# Patient Record
Sex: Female | Born: 2000 | Race: White | Hispanic: No | Marital: Single | Smoking: Never smoker
Health system: Southern US, Community
[De-identification: ages and names within clinical notes are randomized; demographics above are authoritative.]

## PROBLEM LIST (undated history)

## (undated) ENCOUNTER — Inpatient Hospital Stay (HOSPITAL_COMMUNITY): Payer: Self-pay

## (undated) DIAGNOSIS — R748 Abnormal levels of other serum enzymes: Secondary | ICD-10-CM

## (undated) DIAGNOSIS — O2686 Pruritic urticarial papules and plaques of pregnancy (PUPPP): Secondary | ICD-10-CM

## (undated) DIAGNOSIS — F909 Attention-deficit hyperactivity disorder, unspecified type: Secondary | ICD-10-CM

## (undated) HISTORY — DX: Abnormal levels of other serum enzymes: R74.8

## (undated) HISTORY — DX: Pruritic urticarial papules and plaques of pregnancy (puppp): O26.86

## (undated) HISTORY — DX: Attention-deficit hyperactivity disorder, unspecified type: F90.9

---
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
## 2022-02-15 ENCOUNTER — Ambulatory Visit: Payer: Medicaid Other

## 2022-03-16 ENCOUNTER — Ambulatory Visit (INDEPENDENT_AMBULATORY_CARE_PROVIDER_SITE_OTHER): Payer: Medicaid Other | Admitting: *Deleted

## 2022-03-16 DIAGNOSIS — Z3042 Encounter for surveillance of injectable contraceptive: Secondary | ICD-10-CM

## 2022-03-16 MED ORDER — MEDROXYPROGESTERONE ACETATE 150 MG/ML IM SUSP
150.0000 mg | Freq: Once | INTRAMUSCULAR | Status: AC
  Administered 2022-03-16: 150 mg via INTRAMUSCULAR

## 2022-03-16 NOTE — Progress Notes (Signed)
   NURSE VISIT- INJECTION  SUBJECTIVE:  Chelsea Armstrong is a 21 y.o. G48P1001 female here for a Depo Provera for contraception/period management. She is a GYN patient.   OBJECTIVE:  LMP 03/16/2022   Breastfeeding No   Appears well, in no apparent distress  Injection administered in: Right deltoid  Meds ordered this encounter  Medications   medroxyPROGESTERone (DEPO-PROVERA) injection 150 mg    ASSESSMENT: GYN patient Depo Provera for contraception/period management PLAN: Follow-up: in 11-13 weeks for next Depo   Levy Pupa  03/16/2022 4:02 PM

## 2022-03-29 ENCOUNTER — Ambulatory Visit: Payer: Medicaid Other | Admitting: Adult Health

## 2022-04-10 ENCOUNTER — Emergency Department (HOSPITAL_COMMUNITY)
Admission: EM | Admit: 2022-04-10 | Discharge: 2022-04-11 | Disposition: A | Discharge status: Home / Self Care | Payer: Medicaid Other | Attending: Emergency Medicine | Admitting: Emergency Medicine

## 2022-04-10 ENCOUNTER — Encounter (HOSPITAL_COMMUNITY): Payer: Self-pay | Admitting: Emergency Medicine

## 2022-04-10 ENCOUNTER — Other Ambulatory Visit: Payer: Self-pay

## 2022-04-10 DIAGNOSIS — M25531 Pain in right wrist: Secondary | ICD-10-CM | POA: Diagnosis not present

## 2022-04-10 DIAGNOSIS — M25552 Pain in left hip: Secondary | ICD-10-CM | POA: Insufficient documentation

## 2022-04-10 DIAGNOSIS — Z0471 Encounter for examination and observation following alleged adult physical abuse: Secondary | ICD-10-CM | POA: Diagnosis not present
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
## 2022-04-11 ENCOUNTER — Emergency Department (HOSPITAL_COMMUNITY): Payer: Medicaid Other

## 2022-04-11 DIAGNOSIS — Z0471 Encounter for examination and observation following alleged adult physical abuse: Secondary | ICD-10-CM | POA: Diagnosis not present

## 2022-04-11 DIAGNOSIS — M25552 Pain in left hip: Secondary | ICD-10-CM | POA: Diagnosis not present

## 2022-04-11 LAB — POC URINE PREG, ED: Preg Test, Ur: NEGATIVE

## 2022-04-11 MED ORDER — ACETAMINOPHEN 500 MG PO TABS: 1000.0000 mg | ORAL_TABLET | Freq: Once | ORAL | Status: DC

## 2022-04-11 NOTE — ED Notes (Signed)
Pt left AMA then came back then ran out.
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
## 2022-05-06 DIAGNOSIS — F4320 Adjustment disorder, unspecified: Secondary | ICD-10-CM | POA: Diagnosis not present

## 2022-06-08 ENCOUNTER — Ambulatory Visit: Payer: Medicaid Other

## 2022-06-26 DIAGNOSIS — Z20822 Contact with and (suspected) exposure to covid-19: Secondary | ICD-10-CM | POA: Diagnosis not present

## 2022-06-26 DIAGNOSIS — Z5941 Food insecurity: Secondary | ICD-10-CM | POA: Diagnosis not present

## 2022-06-26 DIAGNOSIS — Z1152 Encounter for screening for COVID-19: Secondary | ICD-10-CM | POA: Diagnosis not present

## 2022-06-26 DIAGNOSIS — K529 Noninfective gastroenteritis and colitis, unspecified: Secondary | ICD-10-CM | POA: Diagnosis not present

## 2022-06-26 DIAGNOSIS — E876 Hypokalemia: Secondary | ICD-10-CM | POA: Diagnosis not present

## 2022-09-02 ENCOUNTER — Ambulatory Visit (INDEPENDENT_AMBULATORY_CARE_PROVIDER_SITE_OTHER): Payer: Medicaid Other | Admitting: Adult Health

## 2022-09-02 ENCOUNTER — Encounter: Payer: Self-pay | Admitting: Adult Health

## 2022-09-02 VITALS — BP 117/74 | HR 85 | Ht 60.0 in | Wt 179.0 lb

## 2022-09-02 DIAGNOSIS — Z3202 Encounter for pregnancy test, result negative: Secondary | ICD-10-CM

## 2022-09-02 DIAGNOSIS — Z113 Encounter for screening for infections with a predominantly sexual mode of transmission: Secondary | ICD-10-CM

## 2022-09-02 DIAGNOSIS — Z30013 Encounter for initial prescription of injectable contraceptive: Secondary | ICD-10-CM | POA: Diagnosis not present

## 2022-09-02 LAB — POCT URINE PREGNANCY: Preg Test, Ur: NEGATIVE

## 2022-09-02 MED ORDER — MEDROXYPROGESTERONE ACETATE 150 MG/ML IM SUSY
150.0000 mg | PREFILLED_SYRINGE | Freq: Once | INTRAMUSCULAR | Status: AC
  Administered 2022-09-02: 150 mg via INTRAMUSCULAR

## 2022-09-02 MED ORDER — MEDROXYPROGESTERONE ACETATE 150 MG/ML IM SUSP: 150.0000 mg | INTRAMUSCULAR | 4 refills | Status: DC

## 2022-09-02 NOTE — Progress Notes (Signed)
  Subjective:     Patient ID: Chelsea Armstrong, female   DOB: [birth year], 22 y.o.   MRN: [id number]  HPI Chelsea Armstrong is a 22 year old white female,single,G1P1001, in to discuss getting on depo, has had in the past. Last sex over 2 weeks ago.  PCP is A Boles PA  Review of Systems Patient denies any headaches, hearing loss, fatigue, blurred vision, shortness of breath, chest pain, abdominal pain, problems with bowel movements, urination, or intercourse. No joint pain or mood swings. Reviewed past medical,surgical, social and family history. Reviewed medications and allergies.     Objective:   Physical Exam BP 117/74 (BP Location: Left Arm, Patient Position: Sitting, Cuff Size: Normal)   Pulse 85   Ht 5' (1.524 m)   Wt 179 lb (81.2 kg)   LMP 08/18/2022 (Approximate)   Breastfeeding No   BMI 34.96 kg/m  UPT is negative    Skin warm and dry.  Lungs: clear to ausculation bilaterally. Cardiovascular: regular rate and rhythm.  Fall risk is low  Upstream - 09/02/22 1126       Pregnancy Intention Screening   Does the patient want to become pregnant in the next year? No    Does the patient's partner want to become pregnant in the next year? No    Would the patient like to discuss contraceptive options today? No      Contraception Wrap Up   Current Method Abstinence    End Method Hormonal Injection    Contraception Counseling Provided Yes    How was the end contraceptive method provided? Provided on site   rx sent            Assessment:     1. Screening examination for STD (sexually transmitted disease) Urine sent for GC/CHL - GC/Chlamydia Probe Amp  2. Pregnancy examination or test, negative result - POCT urine pregnancy  3. Encounter for initial prescription of injectable contraceptive Pt wants depo, first injection given in office today Rx sent for depo provera to [facility] Meds ordered this encounter  Medications   medroxyPROGESTERone (DEPO-PROVERA) 150 MG/ML injection    Sig:  Inject 1 mL (150 mg total) into the muscle every 3 (three) months.    Dispense:  1 mL    Refill:  4    Order Specific Question:   Supervising Provider    Answer:   Duane Lope H [[id number]]   Use condoms for 2 weeks      Plan:     Return in 12 weeks for depo

## 2022-09-02 NOTE — Addendum Note (Signed)
Addended by: Colen Darling on: 09/02/2022 12:27 PM   Modules accepted: Orders

## 2022-09-04 LAB — GC/CHLAMYDIA PROBE AMP
Chlamydia trachomatis, NAA: NEGATIVE
Neisseria Gonorrhoeae by PCR: NEGATIVE

## 2022-10-02 DIAGNOSIS — L239 Allergic contact dermatitis, unspecified cause: Secondary | ICD-10-CM | POA: Diagnosis not present

## 2022-10-20 ENCOUNTER — Ambulatory Visit (INDEPENDENT_AMBULATORY_CARE_PROVIDER_SITE_OTHER): Payer: Medicaid Other | Admitting: *Deleted

## 2022-10-20 VITALS — BP 129/85 | HR 69 | Ht 60.0 in | Wt 174.2 lb

## 2022-10-20 DIAGNOSIS — Z3201 Encounter for pregnancy test, result positive: Secondary | ICD-10-CM

## 2022-10-20 LAB — POCT URINE PREGNANCY: Preg Test, Ur: POSITIVE — AB
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
## 2022-10-22 LAB — BETA HCG QUANT (REF LAB): hCG Quant: 83012 m[IU]/mL

## 2022-11-17 DIAGNOSIS — N3001 Acute cystitis with hematuria: Secondary | ICD-10-CM | POA: Diagnosis not present

## 2022-11-17 DIAGNOSIS — O2311 Infections of bladder in pregnancy, first trimester: Secondary | ICD-10-CM | POA: Diagnosis not present

## 2022-11-17 DIAGNOSIS — O219 Vomiting of pregnancy, unspecified: Secondary | ICD-10-CM | POA: Diagnosis not present

## 2022-11-17 DIAGNOSIS — Z3A13 13 weeks gestation of pregnancy: Secondary | ICD-10-CM | POA: Diagnosis not present

## 2022-11-17 DIAGNOSIS — R1030 Lower abdominal pain, unspecified: Secondary | ICD-10-CM | POA: Diagnosis not present

## 2022-11-19 ENCOUNTER — Other Ambulatory Visit: Payer: Self-pay | Admitting: Obstetrics & Gynecology

## 2022-11-19 DIAGNOSIS — O3680X0 Pregnancy with inconclusive fetal viability, not applicable or unspecified: Secondary | ICD-10-CM

## 2022-11-22 ENCOUNTER — Ambulatory Visit: Payer: Medicaid Other

## 2022-11-22 DIAGNOSIS — Z3481 Encounter for supervision of other normal pregnancy, first trimester: Secondary | ICD-10-CM

## 2022-11-22 DIAGNOSIS — Z3A13 13 weeks gestation of pregnancy: Secondary | ICD-10-CM | POA: Diagnosis not present

## 2022-11-22 DIAGNOSIS — O3680X0 Pregnancy with inconclusive fetal viability, not applicable or unspecified: Secondary | ICD-10-CM

## 2022-11-22 NOTE — Progress Notes (Signed)
[place] 13+5 wks,CRL 85.38 mm,fundal placenta,normal ovaries,FHR 153 bpm

## 2022-11-25 ENCOUNTER — Ambulatory Visit: Payer: Medicaid Other

## 2022-12-02 DIAGNOSIS — Z3490 Encounter for supervision of normal pregnancy, unspecified, unspecified trimester: Secondary | ICD-10-CM | POA: Insufficient documentation

## 2022-12-02 DIAGNOSIS — O0932 Supervision of pregnancy with insufficient antenatal care, second trimester: Secondary | ICD-10-CM | POA: Insufficient documentation

## 2022-12-09 ENCOUNTER — Ambulatory Visit: Payer: Medicaid Other | Admitting: *Deleted

## 2022-12-09 ENCOUNTER — Other Ambulatory Visit (HOSPITAL_COMMUNITY)
Admission: RE | Admit: 2022-12-09 | Discharge: 2022-12-09 | Disposition: A | Discharge status: Home / Self Care | Payer: Medicaid Other | Source: Ambulatory Visit | Attending: Obstetrics and Gynecology | Admitting: Obstetrics and Gynecology

## 2022-12-09 ENCOUNTER — Ambulatory Visit (INDEPENDENT_AMBULATORY_CARE_PROVIDER_SITE_OTHER): Payer: Medicaid Other | Admitting: Obstetrics and Gynecology

## 2022-12-09 VITALS — BP 105/72 | HR 107 | Wt 168.8 lb

## 2022-12-09 DIAGNOSIS — Z8759 Personal history of other complications of pregnancy, childbirth and the puerperium: Secondary | ICD-10-CM

## 2022-12-09 DIAGNOSIS — R1031 Right lower quadrant pain: Secondary | ICD-10-CM | POA: Diagnosis not present

## 2022-12-09 DIAGNOSIS — Z3480 Encounter for supervision of other normal pregnancy, unspecified trimester: Secondary | ICD-10-CM | POA: Insufficient documentation

## 2022-12-09 DIAGNOSIS — A749 Chlamydial infection, unspecified: Secondary | ICD-10-CM

## 2022-12-09 DIAGNOSIS — Z3A16 16 weeks gestation of pregnancy: Secondary | ICD-10-CM | POA: Diagnosis not present

## 2022-12-09 DIAGNOSIS — O98812 Other maternal infectious and parasitic diseases complicating pregnancy, second trimester: Secondary | ICD-10-CM | POA: Diagnosis not present

## 2022-12-09 DIAGNOSIS — O0932 Supervision of pregnancy with insufficient antenatal care, second trimester: Secondary | ICD-10-CM

## 2022-12-09 NOTE — Progress Notes (Signed)
Pt c/o cramping on right side.
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
## 2022-12-10 DIAGNOSIS — A749 Chlamydial infection, unspecified: Secondary | ICD-10-CM | POA: Insufficient documentation

## 2022-12-10 DIAGNOSIS — Z8619 Personal history of other infectious and parasitic diseases: Secondary | ICD-10-CM | POA: Insufficient documentation

## 2022-12-10 MED ORDER — AZITHROMYCIN 500 MG PO TABS
1000.0000 mg | ORAL_TABLET | Freq: Once | ORAL | 0 refills | Status: DC
Start: 2022-12-10 — End: 2022-12-13

## 2022-12-10 NOTE — Addendum Note (Signed)
Addended by: Sue Lush on: 12/10/2022 01:15 PM   Modules accepted: Orders

## 2022-12-13 MED ORDER — AZITHROMYCIN 500 MG PO TABS
1000.0000 mg | ORAL_TABLET | Freq: Once | ORAL | 0 refills | Status: AC
Start: 2022-12-13 — End: 2022-12-13

## 2022-12-13 NOTE — Addendum Note (Signed)
Addended by: Sue Lush on: 12/13/2022 09:50 AM   Modules accepted: Orders

## 2022-12-27 DIAGNOSIS — O219 Vomiting of pregnancy, unspecified: Secondary | ICD-10-CM | POA: Diagnosis not present

## 2022-12-27 DIAGNOSIS — Z6833 Body mass index (BMI) 33.0-33.9, adult: Secondary | ICD-10-CM | POA: Diagnosis not present

## 2022-12-27 DIAGNOSIS — E669 Obesity, unspecified: Secondary | ICD-10-CM | POA: Diagnosis not present

## 2022-12-27 DIAGNOSIS — R030 Elevated blood-pressure reading, without diagnosis of hypertension: Secondary | ICD-10-CM | POA: Diagnosis not present

## 2023-01-04 ENCOUNTER — Other Ambulatory Visit: Payer: Self-pay | Admitting: Obstetrics & Gynecology

## 2023-01-04 DIAGNOSIS — Z363 Encounter for antenatal screening for malformations: Secondary | ICD-10-CM

## 2023-01-05 ENCOUNTER — Ambulatory Visit (INDEPENDENT_AMBULATORY_CARE_PROVIDER_SITE_OTHER): Payer: Medicaid Other | Admitting: Obstetrics & Gynecology

## 2023-01-05 ENCOUNTER — Ambulatory Visit (INDEPENDENT_AMBULATORY_CARE_PROVIDER_SITE_OTHER): Payer: Medicaid Other

## 2023-01-05 ENCOUNTER — Encounter: Payer: Self-pay | Admitting: Obstetrics & Gynecology

## 2023-01-05 VITALS — BP 125/76 | HR 100 | Wt 174.2 lb

## 2023-01-05 DIAGNOSIS — Z363 Encounter for antenatal screening for malformations: Secondary | ICD-10-CM | POA: Diagnosis not present

## 2023-01-05 DIAGNOSIS — O35EXX0 Maternal care for other (suspected) fetal abnormality and damage, fetal genitourinary anomalies, not applicable or unspecified: Secondary | ICD-10-CM | POA: Insufficient documentation

## 2023-01-05 DIAGNOSIS — Z8619 Personal history of other infectious and parasitic diseases: Secondary | ICD-10-CM | POA: Diagnosis not present

## 2023-01-05 DIAGNOSIS — Z3A20 20 weeks gestation of pregnancy: Secondary | ICD-10-CM

## 2023-01-05 DIAGNOSIS — Z3480 Encounter for supervision of other normal pregnancy, unspecified trimester: Secondary | ICD-10-CM

## 2023-01-05 DIAGNOSIS — O0932 Supervision of pregnancy with insufficient antenatal care, second trimester: Secondary | ICD-10-CM

## 2023-01-05 DIAGNOSIS — Z09 Encounter for follow-up examination after completed treatment for conditions other than malignant neoplasm: Secondary | ICD-10-CM | POA: Diagnosis not present

## 2023-01-05 NOTE — Progress Notes (Signed)
LOW-RISK PREGNANCY VISIT Patient name: Chelsea Armstrong MRN [id number]  Date of birth: 2000/08/06 Chief Complaint:   Routine Prenatal Visit  History of Present Illness:   Chelsea Armstrong is a 22 y.o. G12P1001 female at [redacted]w[redacted]d with an Estimated Date of Delivery: 05/25/23 being seen today for ongoing management of a low-risk pregnancy.      12/09/2022   10:32 AM 02/04/2021    3:40 PM 12/25/2020    3:45 PM  Depression screen PHQ 2/9  Decreased Interest 1 0 0  Down, Depressed, Hopeless 0 0 0  PHQ - 2 Score 1 0 0  Altered sleeping  2 1  Tired, decreased energy 3 1 2   Change in appetite 3 0 1  Feeling bad or failure about yourself  3 1 0  Trouble concentrating 0 0 0  Moving slowly or fidgety/restless 0 0 0  Suicidal thoughts 0 0 0  PHQ-9 Score  4 4    Today she reports no complaints. Contractions: Not present. Vag. Bleeding: None.  Movement: Present. denies leaking of fluid. Review of Systems:   Pertinent items are noted in HPI Denies abnormal vaginal discharge w/ itching/odor/irritation, headaches, visual changes, shortness of breath, chest pain, abdominal pain, severe nausea/vomiting, or problems with urination or bowel movements unless otherwise stated above. Pertinent History Reviewed:  Reviewed past medical,surgical, social, obstetrical and family history.  Reviewed problem list, medications and allergies.  Physical Assessment:   Vitals:   01/05/23 1459  BP: 125/76  Pulse: 100  Weight: 174 lb 3.2 oz (79 kg)  Body mass index is 34.02 kg/m.        Physical Examination:   General appearance: Well appearing, and in no distress  Mental status: Alert, oriented to person, place, and time  Skin: Warm & dry  Respiratory: Normal respiratory effort, no distress  Abdomen: Soft, gravid, nontender  Pelvic: Cervical exam deferred         Extremities:  no edema  Psych:  mood and affect appropriate  Fetal Status:     Movement: Present [place] 20 wks,breech,anterior placenta gr 0,normal  ovaries,cx 3.8 cm,SVP of fluid 5.2 cm,FHR 159 bpm,mild bilateral renal pelvic dilatation,RK 4 mm,LK 4 mm,EFW 424 g 98%,anatomy complete    Chaperone: n/a    No results found for this or any previous visit (from the past 24 hour(s)).   Assessment & Plan:  1) Low-risk pregnancy G2P1001 at [redacted]w[redacted]d with an Estimated Date of Delivery: 05/25/23   -Anatomy scan today, bilateral RPD, []  plan for repeat US 28-30wks  -h/o 4th deg with LGA: While it's too early for LGA, current EFW 98%.  Discussed that in light of prior 9#9oz baby and 4th degree- concern for recurrence of both LGA and potential for significant laceration.  Briefly discussed primary C-section vs SVD   Meds: No orders of the defined types were placed in this encounter.  Labs/procedures today: anatomy scan  Plan:  Continue routine obstetrical care, growth scan in third trimester Next visit: prefers in person    Reviewed: Preterm labor symptoms and general obstetric precautions including but not limited to vaginal bleeding, contractions, leaking of fluid and fetal movement were reviewed in detail with the patient.  All questions were answered.  Follow-up: Return in about 4 weeks (around 02/02/2023) for LROB visit AND growth scan in 8-10weeks.  Orders Placed This Encounter  Procedures   GC/Chlamydia Probe Amp    Myna Hidalgo, DO Attending Obstetrician & Gynecologist, [facility] for [employer], [facility]  '

## 2023-01-05 NOTE — Progress Notes (Signed)
[place] 20 wks,breech,anterior placenta gr 0,normal ovaries,cx 3.8 cm,SVP of fluid 5.2 cm,FHR 159 bpm,mild bilateral renal pelvic dilatation,RK 4 mm,LK 4 mm,EFW 424 g 98%,anatomy complete

## 2023-01-06 ENCOUNTER — Encounter: Payer: Medicaid Other | Admitting: Women's Health

## 2023-01-09 LAB — GC/CHLAMYDIA PROBE AMP
Chlamydia trachomatis, NAA: POSITIVE — AB
Neisseria Gonorrhoeae by PCR: NEGATIVE

## 2023-01-10 LAB — PANORAMA PRENATAL TEST FULL PANEL:PANORAMA TEST PLUS 5 ADDITIONAL MICRODELETIONS: FETAL FRACTION: 6.8

## 2023-01-11 ENCOUNTER — Other Ambulatory Visit: Payer: Self-pay | Admitting: Obstetrics & Gynecology

## 2023-01-11 DIAGNOSIS — A749 Chlamydial infection, unspecified: Secondary | ICD-10-CM

## 2023-01-11 MED ORDER — AZITHROMYCIN 500 MG PO TABS
1000.0000 mg | ORAL_TABLET | Freq: Once | ORAL | 1 refills | Status: AC
Start: 2023-01-11 — End: 2023-01-11

## 2023-01-11 NOTE — Progress Notes (Signed)
Rx for Azithromycin sent in as well as refill for partner.  Avoid sex at a minimum x 7 weeks- ideally until [facility] completed  Myna Hidalgo, DO Attending Obstetrician & Gynecologist, [facility] for [employer], [facility]

## 2023-01-27 ENCOUNTER — Encounter: Payer: Self-pay | Admitting: Advanced Practice Midwife

## 2023-02-01 ENCOUNTER — Other Ambulatory Visit: Payer: Self-pay | Admitting: Women's Health

## 2023-02-01 ENCOUNTER — Other Ambulatory Visit: Payer: Self-pay | Admitting: Obstetrics and Gynecology

## 2023-02-01 ENCOUNTER — Encounter: Payer: Self-pay | Admitting: Advanced Practice Midwife

## 2023-02-01 DIAGNOSIS — O98812 Other maternal infectious and parasitic diseases complicating pregnancy, second trimester: Secondary | ICD-10-CM

## 2023-02-01 MED ORDER — ONDANSETRON HCL 4 MG PO TABS: 4.0000 mg | ORAL_TABLET | Freq: Three times a day (TID) | ORAL | 6 refills | Status: DC | PRN

## 2023-02-01 NOTE — Telephone Encounter (Signed)
Rx was send 01/11/23 by another provider

## 2023-02-02 ENCOUNTER — Encounter: Payer: Self-pay | Admitting: Advanced Practice Midwife

## 2023-02-02 ENCOUNTER — Ambulatory Visit (INDEPENDENT_AMBULATORY_CARE_PROVIDER_SITE_OTHER): Payer: Medicaid Other | Admitting: Advanced Practice Midwife

## 2023-02-02 VITALS — BP 114/78 | HR 105 | Wt 180.0 lb

## 2023-02-02 DIAGNOSIS — Z3A24 24 weeks gestation of pregnancy: Secondary | ICD-10-CM | POA: Diagnosis not present

## 2023-02-02 DIAGNOSIS — Z8619 Personal history of other infectious and parasitic diseases: Secondary | ICD-10-CM

## 2023-02-02 DIAGNOSIS — Z09 Encounter for follow-up examination after completed treatment for conditions other than malignant neoplasm: Secondary | ICD-10-CM

## 2023-02-02 DIAGNOSIS — Z3482 Encounter for supervision of other normal pregnancy, second trimester: Secondary | ICD-10-CM

## 2023-02-02 DIAGNOSIS — Z3480 Encounter for supervision of other normal pregnancy, unspecified trimester: Secondary | ICD-10-CM

## 2023-02-02 NOTE — Addendum Note (Signed)
Addended by: Caralyn Guile on: 02/02/2023 03:24 PM   Modules accepted: Orders

## 2023-02-02 NOTE — Patient Instructions (Signed)
Chelsea Armstrong, I greatly value your feedback.  If you receive a survey following your visit with [place] today, we appreciate you taking the time to fill it out.  Thanks, Philipp Deputy, CNM   You will have your sugar test next visit.  Please do not eat or drink anything after midnight the night before you come, not even water.  You will be here for at least two hours.  Please make an appointment online for the bloodwork at [email or web address] for 8:30am (or as close to this as possible). Make sure you select the [facility] service center. The day of the appointment, check in with our office first, then you will go to [facility] to start the sugar test.    [facility] HAS MOVED!!! It is now [facility] at [facility] ([address] [place], [place] [address]) Entrance C, located off of [address] valet parking  Go to [email or web address] to register for FREE online childbirth classes   Call the office [phone]) or go to [facility] if: You begin to have strong, frequent contractions Your water breaks.  Sometimes it is a big gush of fluid, sometimes it is just a trickle that keeps getting your panties wet or running down your legs You have vaginal bleeding.  It is normal to have a small amount of spotting if your cervix was checked.  You don't feel your baby moving like normal.  If you don't, get you something to eat and drink and lay down and focus on feeling your baby move.   If your baby is still not moving like normal, you should call the office or go to [facility]  [place] Pediatricians/Family Doctors: [facility] [phone]           [facility] [phone]                [facility] [phone] (usually not accepting new patients unless you have family there already, you are always welcome to call and ask)      [facility] Department [phone]       [facility] Pediatricians/Family Doctors:  [facility]: [phone] [facility]: [phone] [facility]: [phone]  [facility] Doctors:  [facility]: [phone]  [facility]: [phone]  [facility] Doctors: [facility]: [phone]   Home Blood Pressure Monitoring for Patients   Your provider has recommended that you check your blood pressure (BP) at least once a week at home. If you do not have a blood pressure cuff at home, one will be provided for you. Contact your provider if you have not received your monitor within 1 week.   Helpful Tips for Accurate Home Blood Pressure Checks  Don't smoke, exercise, or drink caffeine 30 minutes before checking your BP Use the restroom before checking your BP (a full bladder can raise your pressure) Relax in a comfortable upright chair Feet on the ground Left arm resting comfortably on a flat surface at the level of your heart Legs uncrossed Back supported Sit quietly and don't talk Place the cuff on your bare arm Adjust snuggly, so that only two fingertips can fit between your skin and the top of the cuff Check 2 readings separated by at least one minute Keep a log of your BP readings For a visual, please reference this diagram: http://ccnc.care/bpdiagram  Provider Name: [facility]     Phone: [phone]  Zone 1: ALL CLEAR  Continue to monitor your symptoms:  BP reading is less than 140 (top number) or less than 90 (bottom number)  No right upper stomach pain No headaches or seeing spots No feeling nauseated or throwing up No swelling in face and hands  Zone 2: CAUTION Call your doctor's office for any of the following:  BP reading is greater than 140 (top number) or greater than 90 (bottom number)  Stomach pain under your ribs in the middle or right side Headaches or seeing spots Feeling nauseated or throwing up Swelling in face and hands  Zone 3: EMERGENCY  Seek  immediate medical care if you have any of the following:  BP reading is greater than160 (top number) or greater than 110 (bottom number) Severe headaches not improving with Tylenol Serious difficulty catching your breath Any worsening symptoms from Zone 2   Second Trimester of Pregnancy The second trimester is from week 13 through week 28, months 4 through 6. The second trimester is often a time when you feel your best. Your body has also adjusted to being pregnant, and you begin to feel better physically. Usually, morning sickness has lessened or quit completely, you may have more energy, and you may have an increase in appetite. The second trimester is also a time when the fetus is growing rapidly. At the end of the sixth month, the fetus is about 9 inches long and weighs about 1 pounds. You will likely begin to feel the baby move (quickening) between 18 and 20 weeks of the pregnancy. BODY CHANGES Your body goes through many changes during pregnancy. The changes vary from woman to woman.  Your weight will continue to increase. You will notice your lower abdomen bulging out. You may begin to get stretch marks on your hips, abdomen, and breasts. You may develop headaches that can be relieved by medicines approved by your health care provider. You may urinate more often because the fetus is pressing on your bladder. You may develop or continue to have heartburn as a result of your pregnancy. You may develop constipation because certain hormones are causing the muscles that push waste through your intestines to slow down. You may develop hemorrhoids or swollen, bulging veins (varicose veins). You may have back pain because of the weight gain and pregnancy hormones relaxing your joints between the bones in your pelvis and as a result of a shift in weight and the muscles that support your balance. Your breasts will continue to grow and be tender. Your gums may bleed and may be sensitive to brushing  and flossing. Dark spots or blotches (chloasma, mask of pregnancy) may develop on your face. This will likely fade after the baby is born. A dark line from your belly button to the pubic area (linea nigra) may appear. This will likely fade after the baby is born. You may have changes in your hair. These can include thickening of your hair, rapid growth, and changes in texture. Some women also have hair loss during or after pregnancy, or hair that feels dry or thin. Your hair will most likely return to normal after your baby is born. WHAT TO EXPECT AT YOUR PRENATAL VISITS During a routine prenatal visit: You will be weighed to make sure you and the fetus are growing normally. Your blood pressure will be taken. Your abdomen will be measured to track your baby's growth. The fetal heartbeat will be listened to. Any test results from the previous visit will be discussed. Your health care provider  may ask you: How you are feeling. If you are feeling the baby move. If you have had any abnormal symptoms, such as leaking fluid, bleeding, severe headaches, or abdominal cramping. If you have any questions. Other tests that may be performed during your second trimester include: Blood tests that check for: Low iron levels (anemia). Gestational diabetes (between 24 and 28 weeks). Rh antibodies. Urine tests to check for infections, diabetes, or protein in the urine. An ultrasound to confirm the proper growth and development of the baby. An amniocentesis to check for possible genetic problems. Fetal screens for spina bifida and Down syndrome. HOME CARE INSTRUCTIONS  Avoid all smoking, herbs, alcohol, and unprescribed drugs. These chemicals affect the formation and growth of the baby. Follow your health care provider's instructions regarding medicine use. There are medicines that are either safe or unsafe to take during pregnancy. Exercise only as directed by your health care provider. Experiencing  uterine cramps is a good sign to stop exercising. Continue to eat regular, healthy meals. Wear a good support bra for breast tenderness. Do not use hot tubs, steam rooms, or saunas. Wear your seat belt at all times when driving. Avoid raw meat, uncooked cheese, cat litter boxes, and soil used by cats. These carry germs that can cause birth defects in the baby. Take your prenatal vitamins. Try taking a stool softener (if your health care provider approves) if you develop constipation. Eat more high-fiber foods, such as fresh vegetables or fruit and whole grains. Drink plenty of fluids to keep your urine clear or pale yellow. Take warm sitz baths to soothe any pain or discomfort caused by hemorrhoids. Use hemorrhoid cream if your health care provider approves. If you develop varicose veins, wear support hose. Elevate your feet for 15 minutes, 3-4 times a day. Limit salt in your diet. Avoid heavy lifting, wear low heel shoes, and practice good posture. Rest with your legs elevated if you have leg cramps or low back pain. Visit your dentist if you have not gone yet during your pregnancy. Use a soft toothbrush to brush your teeth and be gentle when you floss. A sexual relationship may be continued unless your health care provider directs you otherwise. Continue to go to all your prenatal visits as directed by your health care provider. SEEK MEDICAL CARE IF:  You have dizziness. You have mild pelvic cramps, pelvic pressure, or nagging pain in the abdominal area. You have persistent nausea, vomiting, or diarrhea. You have a bad smelling vaginal discharge. You have pain with urination. SEEK IMMEDIATE MEDICAL CARE IF:  You have a fever. You are leaking fluid from your vagina. You have spotting or bleeding from your vagina. You have severe abdominal cramping or pain. You have rapid weight gain or loss. You have shortness of breath with chest pain. You notice sudden or extreme swelling of your face,  hands, ankles, feet, or legs. You have not felt your baby move in over an hour. You have severe headaches that do not go away with medicine. You have vision changes. Document Released: 04/20/2001 Document Revised: 05/01/2013 Document Reviewed: 06/27/2012 [facility] Patient Information 2015 [place], [place]. This information is not intended to replace advice given to you by your health care provider. Make sure you discuss any questions you have with your health care provider.

## 2023-02-02 NOTE — Progress Notes (Signed)
LOW-RISK PREGNANCY VISIT Patient name: Chelsea Armstrong MRN [id number]  Date of birth: Mar 30, 2001 Chief Complaint:   Routine Prenatal Visit (Zofran is not working)  History of Present Illness:   Chelsea Armstrong is a 22 y.o. G89P1001 female at [redacted]w[redacted]d with an Estimated Date of Delivery: 05/25/23 being seen today for ongoing management of a low-risk pregnancy.  Today she reports  vomiting daily, the Zofran doesn't stay down . Contractions: Not present. Vag. Bleeding: None.  Movement: Present. denies leaking of fluid. Review of Systems:   Pertinent items are noted in HPI Denies abnormal vaginal discharge w/ itching/odor/irritation, headaches, visual changes, shortness of breath, chest pain, abdominal pain, severe nausea/vomiting, or problems with urination or bowel movements unless otherwise stated above. Pertinent History Reviewed:  Reviewed past medical,surgical, social, obstetrical and family history.  Reviewed problem list, medications and allergies. Physical Assessment:   Vitals:   02/02/23 1411  BP: 114/78  Pulse: (!) 105  Weight: 180 lb (81.6 kg)  Body mass index is 35.15 kg/m.        Physical Examination:   General appearance: Well appearing, and in no distress  Mental status: Alert, oriented to person, place, and time  Skin: Warm & dry  Cardiovascular: Normal heart rate noted  Respiratory: Normal respiratory effort, no distress  Abdomen: Soft, gravid, nontender  Pelvic: Cervical exam deferred         Extremities: Edema: None  Fetal Status: Fetal Heart Rate (bpm): 156 Fundal Height: 24 cm Movement: Present    No results found for this or any previous visit (from the past 24 hour(s)).  Assessment & Plan:  1) Low-risk pregnancy G2P1001 at [redacted]w[redacted]d with an Estimated Date of Delivery: 05/25/23   2) +chlam x 2, TOC today  3) N/V of preg, declines Phenergan suppositories, but doesn't want to be vomiting daily; she will send a message if she changes her mind on the meds  4) Mild bilat  RPD, has f/u u/s already scheduled for next visit  5) Hx LGA, can plan growth 36-37wks   Meds: No orders of the defined types were placed in this encounter.  Labs/procedures today: urine GC/chlam (TOC)  Plan:  Continue routine obstetrical care   Reviewed: Preterm labor symptoms and general obstetric precautions including but not limited to vaginal bleeding, contractions, leaking of fluid and fetal movement were reviewed in detail with the patient.  All questions were answered. Doesn't have home bp cuff. Check bp weekly, let us know if >140/90.   Follow-up: Return for As scheduled (make sure PN2 is with next visit).  No orders of the defined types were placed in this encounter.  Arabella Merles CNM 02/02/2023 2:42 PM

## 2023-02-05 ENCOUNTER — Encounter: Payer: Self-pay | Admitting: Advanced Practice Midwife

## 2023-02-05 LAB — GC/CHLAMYDIA PROBE AMP
Chlamydia trachomatis, NAA: POSITIVE — AB
Neisseria Gonorrhoeae by PCR: NEGATIVE

## 2023-02-06 ENCOUNTER — Other Ambulatory Visit: Payer: Self-pay | Admitting: Advanced Practice Midwife

## 2023-02-06 DIAGNOSIS — A749 Chlamydial infection, unspecified: Secondary | ICD-10-CM

## 2023-02-06 MED ORDER — AZITHROMYCIN 500 MG PO TABS: 1000.0000 mg | ORAL_TABLET | Freq: Once | ORAL | 0 refills | Status: AC

## 2023-02-08 ENCOUNTER — Other Ambulatory Visit: Payer: Self-pay | Admitting: Women's Health

## 2023-02-08 ENCOUNTER — Other Ambulatory Visit: Payer: Self-pay | Admitting: Advanced Practice Midwife

## 2023-02-08 MED ORDER — PROMETHAZINE HCL 25 MG RE SUPP: 25.0000 mg | Freq: Four times a day (QID) | RECTAL | 0 refills | Status: DC | PRN

## 2023-02-08 MED ORDER — AZITHROMYCIN 500 MG PO TABS: 1000.0000 mg | ORAL_TABLET | Freq: Once | ORAL | 0 refills | Status: AC

## 2023-02-10 ENCOUNTER — Telehealth: Payer: Self-pay | Admitting: *Deleted

## 2023-02-10 NOTE — Telephone Encounter (Signed)
Pt is spotting bright red. She is just getting off work. Hasn't had sex or a BM. Pt is not having any pain. Pt was advised to watch it for now. If bleeding gets heavy or if she starts hurting, call the after hours nurse line for triage. Pt voiced understanding. JSY

## 2023-02-22 ENCOUNTER — Encounter: Payer: Self-pay | Admitting: Advanced Practice Midwife

## 2023-02-28 ENCOUNTER — Other Ambulatory Visit: Payer: Self-pay | Admitting: Obstetrics & Gynecology

## 2023-02-28 DIAGNOSIS — O3662X1 Maternal care for excessive fetal growth, second trimester, fetus 1: Secondary | ICD-10-CM

## 2023-02-28 DIAGNOSIS — O35EXX0 Maternal care for other (suspected) fetal abnormality and damage, fetal genitourinary anomalies, not applicable or unspecified: Secondary | ICD-10-CM

## 2023-02-28 DIAGNOSIS — Z3A28 28 weeks gestation of pregnancy: Secondary | ICD-10-CM

## 2023-03-02 ENCOUNTER — Encounter: Payer: Self-pay | Admitting: Advanced Practice Midwife

## 2023-03-02 ENCOUNTER — Ambulatory Visit: Payer: Medicaid Other | Admitting: Advanced Practice Midwife

## 2023-03-02 ENCOUNTER — Ambulatory Visit (INDEPENDENT_AMBULATORY_CARE_PROVIDER_SITE_OTHER): Payer: Medicaid Other | Admitting: Radiology

## 2023-03-02 ENCOUNTER — Other Ambulatory Visit: Payer: Medicaid Other

## 2023-03-02 VITALS — BP 118/70 | HR 98 | Wt 189.0 lb

## 2023-03-02 DIAGNOSIS — O2686 Pruritic urticarial papules and plaques of pregnancy (PUPPP): Secondary | ICD-10-CM

## 2023-03-02 DIAGNOSIS — A749 Chlamydial infection, unspecified: Secondary | ICD-10-CM

## 2023-03-02 DIAGNOSIS — O35EXX0 Maternal care for other (suspected) fetal abnormality and damage, fetal genitourinary anomalies, not applicable or unspecified: Secondary | ICD-10-CM | POA: Diagnosis not present

## 2023-03-02 DIAGNOSIS — Z3A28 28 weeks gestation of pregnancy: Secondary | ICD-10-CM

## 2023-03-02 DIAGNOSIS — Z3A27 27 weeks gestation of pregnancy: Secondary | ICD-10-CM | POA: Diagnosis not present

## 2023-03-02 DIAGNOSIS — Z131 Encounter for screening for diabetes mellitus: Secondary | ICD-10-CM

## 2023-03-02 DIAGNOSIS — Z09 Encounter for follow-up examination after completed treatment for conditions other than malignant neoplasm: Secondary | ICD-10-CM | POA: Diagnosis not present

## 2023-03-02 DIAGNOSIS — Z1332 Encounter for screening for maternal depression: Secondary | ICD-10-CM

## 2023-03-02 DIAGNOSIS — O3663X1 Maternal care for excessive fetal growth, third trimester, fetus 1: Secondary | ICD-10-CM

## 2023-03-02 DIAGNOSIS — Z3480 Encounter for supervision of other normal pregnancy, unspecified trimester: Secondary | ICD-10-CM | POA: Diagnosis not present

## 2023-03-02 DIAGNOSIS — O98813 Other maternal infectious and parasitic diseases complicating pregnancy, third trimester: Secondary | ICD-10-CM | POA: Diagnosis not present

## 2023-03-02 DIAGNOSIS — O3662X1 Maternal care for excessive fetal growth, second trimester, fetus 1: Secondary | ICD-10-CM

## 2023-03-02 DIAGNOSIS — O98812 Other maternal infectious and parasitic diseases complicating pregnancy, second trimester: Secondary | ICD-10-CM | POA: Diagnosis not present

## 2023-03-02 NOTE — Patient Instructions (Signed)
Elyana, thank you for choosing our office today! We appreciate the opportunity to meet your healthcare needs. You may receive a short survey by mail, e-mail, or through [employer]. If you are happy with your care we would appreciate if you could take just a few minutes to complete the survey questions. We read all of your comments and take your feedback very seriously. Thank you again for choosing our office.  Center for [employer] Team at [facility]  [facility] at [facility] ([address] [place], [place] [address]) Entrance C, located off of [address] Free 24/7 valet parking   CLASSES: Go to [email or web address] to register for classes (childbirth, breastfeeding, waterbirth, infant CPR, daddy bootcamp, etc.)  Call the office [phone]) or go to [facility] if: You begin to have strong, frequent contractions Your water breaks.  Sometimes it is a big gush of fluid, sometimes it is just a trickle that keeps getting your panties wet or running down your legs You have vaginal bleeding.  It is normal to have a small amount of spotting if your cervix was checked.  You don't feel your baby moving like normal.  If you don't, get you something to eat and drink and lay down and focus on feeling your baby move.   If your baby is still not moving like normal, you should call the office or go to [facility].  Call the office [phone]) or go to [facility] hospital for these signs of pre-eclampsia: Severe headache that does not go away with Tylenol Visual changes- seeing spots, double, blurred vision Pain under your right breast or upper abdomen that does not go away with Tums or heartburn medicine Nausea and/or vomiting Severe swelling in your hands, feet, and face   Tdap Vaccine It is recommended that you get the Tdap vaccine during the third trimester of EACH pregnancy to help protect your baby from getting pertussis (whooping cough) 27-36 weeks is the BEST time to do this  so that you can pass the protection on to your baby. During pregnancy is better than after pregnancy, but if you are unable to get it during pregnancy it will be offered at the hospital.  You can get this vaccine with [place], at the health department, your family doctor, or some local pharmacies Everyone who will be around your baby should also be up-to-date on their vaccines before the baby comes. Adults (who are not pregnant) only need 1 dose of Tdap during adulthood.   [facility] Pediatricians/Family Doctors [facility]): [address] Colette Ribas, [phone]           [facility]: [address], [phone]                [facility]): [address], [phone] (call to ask if accepting patients) [facility] Department: [address], [place], [phone]    [facility] Pediatricians/Family Doctors [facility]): [phone] [address], [phone] [facility]: [address] [place], [phone] [facility]: [address], [phone]  [facility] Doctors  [facility]): [phone] [facility]: [address], [phone]   [facility]: [address] [phone]  [facility] Family Doctors  [facility]: [phone], [phone]  Home Blood Pressure Monitoring for Patients   Your provider has recommended that you check your  blood pressure (BP) at least once a week at home. If you do not have a blood pressure cuff at home, one will be provided for you. Contact your provider if you have not received your monitor within 1 week.   Helpful Tips for Accurate Home Blood Pressure Checks  Don't smoke, exercise, or drink caffeine 30 minutes before checking your BP Use the restroom before checking your BP (a full bladder can raise your  pressure) Relax in a comfortable upright chair Feet on the ground Left arm resting comfortably on a flat surface at the level of your heart Legs uncrossed Back supported Sit quietly and don't talk Place the cuff on your bare arm Adjust snuggly, so that only two fingertips can fit between your skin and the top of the cuff Check 2 readings separated by at least one minute Keep a log of your BP readings For a visual, please reference this diagram: http://ccnc.care/bpdiagram  Provider Name: [facility]     Phone: [phone]  Zone 1: ALL CLEAR  Continue to monitor your symptoms:  BP reading is less than 140 (top number) or less than 90 (bottom number)  No right upper stomach pain No headaches or seeing spots No feeling nauseated or throwing up No swelling in face and hands  Zone 2: CAUTION Call your doctor's office for any of the following:  BP reading is greater than 140 (top number) or greater than 90 (bottom number)  Stomach pain under your ribs in the middle or right side Headaches or seeing spots Feeling nauseated or throwing up Swelling in face and hands  Zone 3: EMERGENCY  Seek immediate medical care if you have any of the following:  BP reading is greater than160 (top number) or greater than 110 (bottom number) Severe headaches not improving with Tylenol Serious difficulty catching your breath Any worsening symptoms from Zone 2   Third Trimester of Pregnancy The third trimester is from week 29 through week 42, months 7 through 9. The third trimester is a time when the fetus is growing rapidly. At the end of the ninth month, the fetus is about 20 inches in length and weighs 6-10 pounds.  BODY CHANGES Your body goes through many changes during pregnancy. The changes vary from woman to woman.  Your weight will continue to increase. You can expect to gain 25-35 pounds (11-16 kg) by the end of the pregnancy. You may begin to get stretch marks on your hips, abdomen,  and breasts. You may urinate more often because the fetus is moving lower into your pelvis and pressing on your bladder. You may develop or continue to have heartburn as a result of your pregnancy. You may develop constipation because certain hormones are causing the muscles that push waste through your intestines to slow down. You may develop hemorrhoids or swollen, bulging veins (varicose veins). You may have pelvic pain because of the weight gain and pregnancy hormones relaxing your joints between the bones in your pelvis. Backaches may result from overexertion of the muscles supporting your posture. You may have changes in your hair. These can include thickening of your hair, rapid growth, and changes in texture. Some women also have hair loss during or after pregnancy, or hair that feels dry or thin. Your hair will most likely return to normal after your baby is born. Your breasts will continue to grow and be tender. A yellow discharge may leak from your breasts called colostrum. Your belly button may stick out. You may  feel short of breath because of your expanding uterus. You may notice the fetus "dropping," or moving lower in your abdomen. You may have a bloody mucus discharge. This usually occurs a few days to a week before labor begins. Your cervix becomes thin and soft (effaced) near your due date. WHAT TO EXPECT AT YOUR PRENATAL EXAMS  You will have prenatal exams every 2 weeks until week 36. Then, you will have weekly prenatal exams. During a routine prenatal visit: You will be weighed to make sure you and the fetus are growing normally. Your blood pressure is taken. Your abdomen will be measured to track your baby's growth. The fetal heartbeat will be listened to. Any test results from the previous visit will be discussed. You may have a cervical check near your due date to see if you have effaced. At around 36 weeks, your caregiver will check your cervix. At the same time, your  caregiver will also perform a test on the secretions of the vaginal tissue. This test is to determine if a type of bacteria, Group B streptococcus, is present. Your caregiver will explain this further. Your caregiver may ask you: What your birth plan is. How you are feeling. If you are feeling the baby move. If you have had any abnormal symptoms, such as leaking fluid, bleeding, severe headaches, or abdominal cramping. If you have any questions. Other tests or screenings that may be performed during your third trimester include: Blood tests that check for low iron levels (anemia). Fetal testing to check the health, activity level, and growth of the fetus. Testing is done if you have certain medical conditions or if there are problems during the pregnancy. FALSE LABOR You may feel small, irregular contractions that eventually go away. These are called Braxton Hicks contractions, or false labor. Contractions may last for hours, days, or even weeks before true labor sets in. If contractions come at regular intervals, intensify, or become painful, it is best to be seen by your caregiver.  SIGNS OF LABOR  Menstrual-like cramps. Contractions that are 5 minutes apart or less. Contractions that start on the top of the uterus and spread down to the lower abdomen and back. A sense of increased pelvic pressure or back pain. A watery or bloody mucus discharge that comes from the vagina. If you have any of these signs before the 37th week of pregnancy, call your caregiver right away. You need to go to the hospital to get checked immediately. HOME CARE INSTRUCTIONS  Avoid all smoking, herbs, alcohol, and unprescribed drugs. These chemicals affect the formation and growth of the baby. Follow your caregiver's instructions regarding medicine use. There are medicines that are either safe or unsafe to take during pregnancy. Exercise only as directed by your caregiver. Experiencing uterine cramps is a good sign to  stop exercising. Continue to eat regular, healthy meals. Wear a good support bra for breast tenderness. Do not use hot tubs, steam rooms, or saunas. Wear your seat belt at all times when driving. Avoid raw meat, uncooked cheese, cat litter boxes, and soil used by cats. These carry germs that can cause birth defects in the baby. Take your prenatal vitamins. Try taking a stool softener (if your caregiver approves) if you develop constipation. Eat more high-fiber foods, such as fresh vegetables or fruit and whole grains. Drink plenty of fluids to keep your urine clear or pale yellow. Take warm sitz baths to soothe any pain or discomfort caused by hemorrhoids. Use hemorrhoid cream if  your caregiver approves. If you develop varicose veins, wear support hose. Elevate your feet for 15 minutes, 3-4 times a day. Limit salt in your diet. Avoid heavy lifting, wear low heal shoes, and practice good posture. Rest a lot with your legs elevated if you have leg cramps or low back pain. Visit your dentist if you have not gone during your pregnancy. Use a soft toothbrush to brush your teeth and be gentle when you floss. A sexual relationship may be continued unless your caregiver directs you otherwise. Do not travel far distances unless it is absolutely necessary and only with the approval of your caregiver. Take prenatal classes to understand, practice, and ask questions about the labor and delivery. Make a trial run to the hospital. Pack your hospital bag. Prepare the baby's nursery. Continue to go to all your prenatal visits as directed by your caregiver. SEEK MEDICAL CARE IF: You are unsure if you are in labor or if your water has broken. You have dizziness. You have mild pelvic cramps, pelvic pressure, or nagging pain in your abdominal area. You have persistent nausea, vomiting, or diarrhea. You have a bad smelling vaginal discharge. You have pain with urination. SEEK IMMEDIATE MEDICAL CARE IF:  You  have a fever. You are leaking fluid from your vagina. You have spotting or bleeding from your vagina. You have severe abdominal cramping or pain. You have rapid weight loss or gain. You have shortness of breath with chest pain. You notice sudden or extreme swelling of your face, hands, ankles, feet, or legs. You have not felt your baby move in over an hour. You have severe headaches that do not go away with medicine. You have vision changes. Document Released: 04/20/2001 Document Revised: 05/01/2013 Document Reviewed: 06/27/2012 [facility] Patient Information 2015 [place], [place]. This information is not intended to replace advice given to you by your health care provider. Make sure you discuss any questions you have with your health care provider.

## 2023-03-02 NOTE — Progress Notes (Signed)
LOW-RISK PREGNANCY VISIT Patient name: Chelsea Armstrong MRN [id number]  Date of birth: August 15, 2000 Chief Complaint:   Routine Prenatal Visit  History of Present Illness:   Chelsea Armstrong is a 22 y.o. G68P1001 female at [redacted]w[redacted]d with an Estimated Date of Delivery: 05/25/23 being seen today for ongoing management of a low-risk pregnancy.  Today she reports  feeling well; she and FOB both took azithromycin on 02/08/23 and haven't had sex since . Contractions: Not present. Vag. Bleeding: None.  Movement: Present. denies leaking of fluid. Review of Systems:   Pertinent items are noted in HPI Denies abnormal vaginal discharge w/ itching/odor/irritation, headaches, visual changes, shortness of breath, chest pain, abdominal pain, severe nausea/vomiting, or problems with urination or bowel movements unless otherwise stated above. Pertinent History Reviewed:  Reviewed past medical,surgical, social, obstetrical and family history.  Reviewed problem list, medications and allergies. Physical Assessment:   Vitals:   03/02/23 0959  BP: 118/70  Pulse: 98  Weight: 189 lb (85.7 kg)  Body mass index is 36.91 kg/m.        Physical Examination:   General appearance: Well appearing, and in no distress  Mental status: Alert, oriented to person, place, and time  Skin: Warm & dry  Cardiovascular: Normal heart rate noted  Respiratory: Normal respiratory effort, no distress  Abdomen: Soft, gravid, nontender  Pelvic: Cervical exam deferred         Extremities: Edema: None  Fetal Status: Fetal Heart Rate (bpm): 150 u/s   Movement: Present    F/u RPD: [place] 28 wks Single active female fetus, cephalic EFW 76%   1324g   AC 08% Anterior placenta,    AFI = 21.5 cm  91%     SVP = 6.4 cm  No apparent fetal abn   -  normal kidneys, no evidence of pelviectasis/hydronephrosis CL = 3.7 cm    neg adnexal regions     neg CDS  -  no free fluid    No results found for this or any previous visit (from the past 24 hour(s)).   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  Labs/procedures today: f/u ultrasound; GC/chlam; PN2  Plan:  Continue routine obstetrical care   Reviewed: Preterm labor symptoms and general obstetric precautions including but not limited to vaginal bleeding, contractions, leaking of fluid and fetal movement were reviewed in detail with the patient.  All questions were answered. Doesn't have home bp cuff.   Follow-up: Return in about 2 weeks (around 03/16/2023) for LROB, in person.  Orders Placed This Encounter  Procedures   GC/Chlamydia Probe Amp   Arabella Merles [facility] 03/02/2023 10:42 AM

## 2023-03-02 NOTE — Progress Notes (Signed)
[place] 28 wks Single active female fetus, cephalic EFW 76%   1324g   AC 16% Anterior placenta,    AFI = 21.5 cm  91%     SVP = 6.4 cm  No apparent fetal abn   -  normal kidneys, no evidence of pelviectasis/hydronephrosis CL = 3.7 cm    neg adnexal regions     neg CDS  -  no free fluid

## 2023-03-03 LAB — ANTIBODY SCREEN: Antibody Screen: NEGATIVE

## 2023-03-03 LAB — CBC
Hematocrit: 33.3 % — ABNORMAL LOW (ref 34.0–46.6)
Hemoglobin: 10.3 g/dL — ABNORMAL LOW (ref 11.1–15.9)
MCH: 24.3 pg — ABNORMAL LOW (ref 26.6–33.0)
MCHC: 30.9 g/dL — ABNORMAL LOW (ref 31.5–35.7)
MCV: 79 fL (ref 79–97)
Platelets: 326 10*3/uL (ref 150–450)
RBC: 4.24 x10E6/uL (ref 3.77–5.28)
RDW: 13.6 % (ref 11.7–15.4)
WBC: 9.8 10*3/uL (ref 3.4–10.8)

## 2023-03-03 LAB — RPR: RPR Ser Ql: NONREACTIVE

## 2023-03-03 LAB — HIV ANTIBODY (ROUTINE TESTING W REFLEX): HIV Screen 4th Generation wRfx: NONREACTIVE

## 2023-03-03 LAB — GLUCOSE TOLERANCE, 2 HOURS W/ 1HR
Glucose, 1 hour: 86 mg/dL (ref 70–179)
Glucose, 2 hour: 113 mg/dL (ref 70–152)
Glucose, Fasting: 79 mg/dL (ref 70–91)

## 2023-03-05 LAB — GC/CHLAMYDIA PROBE AMP
Chlamydia trachomatis, NAA: NEGATIVE
Neisseria Gonorrhoeae by PCR: NEGATIVE

## 2023-03-07 ENCOUNTER — Other Ambulatory Visit: Payer: Self-pay | Admitting: Advanced Practice Midwife

## 2023-03-07 DIAGNOSIS — O99013 Anemia complicating pregnancy, third trimester: Secondary | ICD-10-CM

## 2023-03-07 MED ORDER — FERROUS FUMARATE 150 MG PO TABS: 1.0000 | ORAL_TABLET | ORAL | 3 refills | Status: AC

## 2023-03-16 ENCOUNTER — Encounter: Payer: Self-pay | Admitting: Advanced Practice Midwife

## 2023-03-16 ENCOUNTER — Ambulatory Visit (INDEPENDENT_AMBULATORY_CARE_PROVIDER_SITE_OTHER): Payer: Medicaid Other | Admitting: Advanced Practice Midwife

## 2023-03-16 VITALS — BP 116/75 | HR 106 | Wt 191.0 lb

## 2023-03-16 DIAGNOSIS — Z8759 Personal history of other complications of pregnancy, childbirth and the puerperium: Secondary | ICD-10-CM

## 2023-03-16 DIAGNOSIS — Z3480 Encounter for supervision of other normal pregnancy, unspecified trimester: Secondary | ICD-10-CM

## 2023-03-16 DIAGNOSIS — M25552 Pain in left hip: Secondary | ICD-10-CM

## 2023-03-16 DIAGNOSIS — Z3A30 30 weeks gestation of pregnancy: Secondary | ICD-10-CM

## 2023-03-16 NOTE — Progress Notes (Signed)
   LOW-RISK PREGNANCY VISIT Patient name: Chelsea Armstrong MRN [id number]  Date of birth: 2000/10/18 Chief Complaint:   Routine Prenatal Visit (Hip pain)  History of Present Illness:   Chelsea Armstrong is a 22 y.o. G71P1001 female at [redacted]w[redacted]d with an Estimated Date of Delivery: 05/25/23 being seen today for ongoing management of a low-risk pregnancy.  Today she reports  L hip pain becoming more constant; 'gave out' the other day; feels sharp . Contractions: Not present. Vag. Bleeding: None.  Movement: Present. denies leaking of fluid. Review of Systems:   Pertinent items are noted in HPI Denies abnormal vaginal discharge w/ itching/odor/irritation, headaches, visual changes, shortness of breath, chest pain, abdominal pain, severe nausea/vomiting, or problems with urination or bowel movements unless otherwise stated above. Pertinent History Reviewed:  Reviewed past medical,surgical, social, obstetrical and family history.  Reviewed problem list, medications and allergies. Physical Assessment:   Vitals:   03/16/23 1137  BP: 116/75  Pulse: (!) 106  Weight: 191 lb (86.6 kg)  Body mass index is 37.3 kg/m.        Physical Examination:   General appearance: Well appearing, and in no distress  Mental status: Alert, oriented to person, place, and time  Skin: Warm & dry  Cardiovascular: Normal heart rate noted  Respiratory: Normal respiratory effort, no distress  Abdomen: Soft, gravid, nontender  Pelvic: Cervical exam deferred         Extremities: Edema: None  Fetal Status: Fetal Heart Rate (bpm): 142 Fundal Height: 30 cm Movement: Present    No results found for this or any previous visit (from the past 24 hour(s)).  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  Labs/procedures today: none  Plan:  Continue routine obstetrical care   Reviewed:  labor symptoms and general obstetric precautions including but not limited to vaginal bleeding, contractions, leaking of fluid and fetal movement were reviewed in detail with the patient.  All questions were answered. Has home bp cuff. Check bp weekly, let us know if >140/90.   Follow-up: Return for pls add EFW u/s to the 12/18 appt.  Orders Placed This Encounter  Procedures   US OB Follow Up   AMB referral to orthopedics   Arabella Merles [facility] 03/16/2023 12:19 PM

## 2023-03-16 NOTE — Patient Instructions (Signed)
Chelsea Armstrong, thank you for choosing our office today! We appreciate the opportunity to meet your healthcare needs. You may receive a short survey by mail, e-mail, or through [employer]. If you are happy with your care we would appreciate if you could take just a few minutes to complete the survey questions. We read all of your comments and take your feedback very seriously. Thank you again for choosing our office.  Center for [employer] Team at [facility]  [facility] at [facility] ([address] [place], [place] [address]) Entrance C, located off of [address] Free 24/7 valet parking   CLASSES: Go to [email or web address] to register for classes (childbirth, breastfeeding, waterbirth, infant CPR, daddy bootcamp, etc.)  Call the office [phone]) or go to [facility] if: You begin to have strong, frequent contractions Your water breaks.  Sometimes it is a big gush of fluid, sometimes it is just a trickle that keeps getting your panties wet or running down your legs You have vaginal bleeding.  It is normal to have a small amount of spotting if your cervix was checked.  You don't feel your baby moving like normal.  If you don't, get you something to eat and drink and lay down and focus on feeling your baby move.   If your baby is still not moving like normal, you should call the office or go to [facility].  Call the office [phone]) or go to [facility] hospital for these signs of pre-eclampsia: Severe headache that does not go away with Tylenol Visual changes- seeing spots, double, blurred vision Pain under your right breast or upper abdomen that does not go away with Tums or heartburn medicine Nausea and/or vomiting Severe swelling in your hands, feet, and face   Tdap Vaccine It is recommended that you get the Tdap vaccine during the third trimester of EACH pregnancy to help protect your baby from getting pertussis (whooping cough) 27-36 weeks is the BEST time to do this  so that you can pass the protection on to your baby. During pregnancy is better than after pregnancy, but if you are unable to get it during pregnancy it will be offered at the hospital.  You can get this vaccine with [place], at the health department, your family doctor, or some local pharmacies Everyone who will be around your baby should also be up-to-date on their vaccines before the baby comes. Adults (who are not pregnant) only need 1 dose of Tdap during adulthood.   [facility] Pediatricians/Family Doctors [facility]): [address] Colette Ribas, [phone]           [facility]: [address], [phone]                [facility]): [address], [phone] (call to ask if accepting patients) [facility] Department: [address], [place], [phone]    [facility] Pediatricians/Family Doctors [facility]): [phone] [address], [phone] [facility]: [address] [place], [phone] [facility]: [address], [phone]  [facility] Doctors  [facility]): [phone] [facility]: [address], [phone]   [facility]: [address] [phone]  [facility] Family Doctors  [facility]: [phone], [phone]  Home Blood Pressure Monitoring for Patients   Your provider has recommended that you check your  blood pressure (BP) at least once a week at home. If you do not have a blood pressure cuff at home, one will be provided for you. Contact your provider if you have not received your monitor within 1 week.   Helpful Tips for Accurate Home Blood Pressure Checks  Don't smoke, exercise, or drink caffeine 30 minutes before checking your BP Use the restroom before checking your BP (a full bladder can raise your  pressure) Relax in a comfortable upright chair Feet on the ground Left arm resting comfortably on a flat surface at the level of your heart Legs uncrossed Back supported Sit quietly and don't talk Place the cuff on your bare arm Adjust snuggly, so that only two fingertips can fit between your skin and the top of the cuff Check 2 readings separated by at least one minute Keep a log of your BP readings For a visual, please reference this diagram: http://ccnc.care/bpdiagram  Provider Name: [facility]     Phone: [phone]  Zone 1: ALL CLEAR  Continue to monitor your symptoms:  BP reading is less than 140 (top number) or less than 90 (bottom number)  No right upper stomach pain No headaches or seeing spots No feeling nauseated or throwing up No swelling in face and hands  Zone 2: CAUTION Call your doctor's office for any of the following:  BP reading is greater than 140 (top number) or greater than 90 (bottom number)  Stomach pain under your ribs in the middle or right side Headaches or seeing spots Feeling nauseated or throwing up Swelling in face and hands  Zone 3: EMERGENCY  Seek immediate medical care if you have any of the following:  BP reading is greater than160 (top number) or greater than 110 (bottom number) Severe headaches not improving with Tylenol Serious difficulty catching your breath Any worsening symptoms from Zone 2   Third Trimester of Pregnancy The third trimester is from week 29 through week 42, months 7 through 9. The third trimester is a time when the fetus is growing rapidly. At the end of the ninth month, the fetus is about 20 inches in length and weighs 6-10 pounds.  BODY CHANGES Your body goes through many changes during pregnancy. The changes vary from woman to woman.  Your weight will continue to increase. You can expect to gain 25-35 pounds (11-16 kg) by the end of the pregnancy. You may begin to get stretch marks on your hips, abdomen,  and breasts. You may urinate more often because the fetus is moving lower into your pelvis and pressing on your bladder. You may develop or continue to have heartburn as a result of your pregnancy. You may develop constipation because certain hormones are causing the muscles that push waste through your intestines to slow down. You may develop hemorrhoids or swollen, bulging veins (varicose veins). You may have pelvic pain because of the weight gain and pregnancy hormones relaxing your joints between the bones in your pelvis. Backaches may result from overexertion of the muscles supporting your posture. You may have changes in your hair. These can include thickening of your hair, rapid growth, and changes in texture. Some women also have hair loss during or after pregnancy, or hair that feels dry or thin. Your hair will most likely return to normal after your baby is born. Your breasts will continue to grow and be tender. A yellow discharge may leak from your breasts called colostrum. Your belly button may stick out. You may  feel short of breath because of your expanding uterus. You may notice the fetus "dropping," or moving lower in your abdomen. You may have a bloody mucus discharge. This usually occurs a few days to a week before labor begins. Your cervix becomes thin and soft (effaced) near your due date. WHAT TO EXPECT AT YOUR PRENATAL EXAMS  You will have prenatal exams every 2 weeks until week 36. Then, you will have weekly prenatal exams. During a routine prenatal visit: You will be weighed to make sure you and the fetus are growing normally. Your blood pressure is taken. Your abdomen will be measured to track your baby's growth. The fetal heartbeat will be listened to. Any test results from the previous visit will be discussed. You may have a cervical check near your due date to see if you have effaced. At around 36 weeks, your caregiver will check your cervix. At the same time, your  caregiver will also perform a test on the secretions of the vaginal tissue. This test is to determine if a type of bacteria, Group B streptococcus, is present. Your caregiver will explain this further. Your caregiver may ask you: What your birth plan is. How you are feeling. If you are feeling the baby move. If you have had any abnormal symptoms, such as leaking fluid, bleeding, severe headaches, or abdominal cramping. If you have any questions. Other tests or screenings that may be performed during your third trimester include: Blood tests that check for low iron levels (anemia). Fetal testing to check the health, activity level, and growth of the fetus. Testing is done if you have certain medical conditions or if there are problems during the pregnancy. FALSE LABOR You may feel small, irregular contractions that eventually go away. These are called Braxton Hicks contractions, or false labor. Contractions may last for hours, days, or even weeks before true labor sets in. If contractions come at regular intervals, intensify, or become painful, it is best to be seen by your caregiver.  SIGNS OF LABOR  Menstrual-like cramps. Contractions that are 5 minutes apart or less. Contractions that start on the top of the uterus and spread down to the lower abdomen and back. A sense of increased pelvic pressure or back pain. A watery or bloody mucus discharge that comes from the vagina. If you have any of these signs before the 37th week of pregnancy, call your caregiver right away. You need to go to the hospital to get checked immediately. HOME CARE INSTRUCTIONS  Avoid all smoking, herbs, alcohol, and unprescribed drugs. These chemicals affect the formation and growth of the baby. Follow your caregiver's instructions regarding medicine use. There are medicines that are either safe or unsafe to take during pregnancy. Exercise only as directed by your caregiver. Experiencing uterine cramps is a good sign to  stop exercising. Continue to eat regular, healthy meals. Wear a good support bra for breast tenderness. Do not use hot tubs, steam rooms, or saunas. Wear your seat belt at all times when driving. Avoid raw meat, uncooked cheese, cat litter boxes, and soil used by cats. These carry germs that can cause birth defects in the baby. Take your prenatal vitamins. Try taking a stool softener (if your caregiver approves) if you develop constipation. Eat more high-fiber foods, such as fresh vegetables or fruit and whole grains. Drink plenty of fluids to keep your urine clear or pale yellow. Take warm sitz baths to soothe any pain or discomfort caused by hemorrhoids. Use hemorrhoid cream if  your caregiver approves. If you develop varicose veins, wear support hose. Elevate your feet for 15 minutes, 3-4 times a day. Limit salt in your diet. Avoid heavy lifting, wear low heal shoes, and practice good posture. Rest a lot with your legs elevated if you have leg cramps or low back pain. Visit your dentist if you have not gone during your pregnancy. Use a soft toothbrush to brush your teeth and be gentle when you floss. A sexual relationship may be continued unless your caregiver directs you otherwise. Do not travel far distances unless it is absolutely necessary and only with the approval of your caregiver. Take prenatal classes to understand, practice, and ask questions about the labor and delivery. Make a trial run to the hospital. Pack your hospital bag. Prepare the baby's nursery. Continue to go to all your prenatal visits as directed by your caregiver. SEEK MEDICAL CARE IF: You are unsure if you are in labor or if your water has broken. You have dizziness. You have mild pelvic cramps, pelvic pressure, or nagging pain in your abdominal area. You have persistent nausea, vomiting, or diarrhea. You have a bad smelling vaginal discharge. You have pain with urination. SEEK IMMEDIATE MEDICAL CARE IF:  You  have a fever. You are leaking fluid from your vagina. You have spotting or bleeding from your vagina. You have severe abdominal cramping or pain. You have rapid weight loss or gain. You have shortness of breath with chest pain. You notice sudden or extreme swelling of your face, hands, ankles, feet, or legs. You have not felt your baby move in over an hour. You have severe headaches that do not go away with medicine. You have vision changes. Document Released: 04/20/2001 Document Revised: 05/01/2013 Document Reviewed: 06/27/2012 [facility] Patient Information 2015 [place], [place]. This information is not intended to replace advice given to you by your health care provider. Make sure you discuss any questions you have with your health care provider.

## 2023-03-22 ENCOUNTER — Encounter: Payer: Self-pay | Admitting: Orthopedic Surgery

## 2023-03-22 ENCOUNTER — Ambulatory Visit: Payer: Medicaid Other | Admitting: Orthopedic Surgery

## 2023-03-22 VITALS — BP 110/76 | HR 109 | Ht 60.0 in | Wt 180.0 lb

## 2023-03-22 DIAGNOSIS — M7062 Trochanteric bursitis, left hip: Secondary | ICD-10-CM | POA: Diagnosis not present
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
## 2023-03-22 NOTE — Patient Instructions (Signed)

## 2023-03-30 ENCOUNTER — Encounter: Payer: Medicaid Other | Admitting: Advanced Practice Midwife

## 2023-04-09 ENCOUNTER — Encounter: Payer: Self-pay | Admitting: Women's Health

## 2023-04-11 ENCOUNTER — Inpatient Hospital Stay (HOSPITAL_COMMUNITY)
Admission: AD | Admit: 2023-04-11 | Discharge: 2023-04-11 | Disposition: A | Discharge status: Home / Self Care | Payer: Medicaid Other | Attending: Obstetrics & Gynecology | Admitting: Obstetrics & Gynecology

## 2023-04-11 ENCOUNTER — Other Ambulatory Visit: Payer: Self-pay

## 2023-04-11 ENCOUNTER — Encounter (HOSPITAL_COMMUNITY): Payer: Self-pay | Admitting: Obstetrics & Gynecology

## 2023-04-11 DIAGNOSIS — R102 Pelvic and perineal pain: Secondary | ICD-10-CM | POA: Insufficient documentation

## 2023-04-11 DIAGNOSIS — Z3A33 33 weeks gestation of pregnancy: Secondary | ICD-10-CM | POA: Diagnosis not present

## 2023-04-11 DIAGNOSIS — N949 Unspecified condition associated with female genital organs and menstrual cycle: Secondary | ICD-10-CM | POA: Diagnosis not present

## 2023-04-11 DIAGNOSIS — R1031 Right lower quadrant pain: Secondary | ICD-10-CM | POA: Insufficient documentation

## 2023-04-11 DIAGNOSIS — O26893 Other specified pregnancy related conditions, third trimester: Secondary | ICD-10-CM | POA: Diagnosis not present

## 2023-04-11 LAB — CBC WITH DIFFERENTIAL/PLATELET
Abs Immature Granulocytes: 0.06 10*3/uL (ref 0.00–0.07)
Basophils Absolute: 0 10*3/uL (ref 0.0–0.1)
Basophils Relative: 0 %
Eosinophils Absolute: 0.1 10*3/uL (ref 0.0–0.5)
Eosinophils Relative: 1 %
HCT: 32.1 % — ABNORMAL LOW (ref 36.0–46.0)
Hemoglobin: 9.8 g/dL — ABNORMAL LOW (ref 12.0–15.0)
Immature Granulocytes: 1 %
Lymphocytes Relative: 22 %
Lymphs Abs: 2.4 10*3/uL (ref 0.7–4.0)
MCH: 22.9 pg — ABNORMAL LOW (ref 26.0–34.0)
MCHC: 30.5 g/dL (ref 30.0–36.0)
MCV: 75 fL — ABNORMAL LOW (ref 80.0–100.0)
Monocytes Absolute: 0.9 10*3/uL (ref 0.1–1.0)
Monocytes Relative: 8 %
Neutro Abs: 7.3 10*3/uL (ref 1.7–7.7)
Neutrophils Relative %: 68 %
Platelets: 274 10*3/uL (ref 150–400)
RBC: 4.28 MIL/uL (ref 3.87–5.11)
RDW: 15.9 % — ABNORMAL HIGH (ref 11.5–15.5)
WBC: 10.7 10*3/uL — ABNORMAL HIGH (ref 4.0–10.5)
nRBC: 0 % (ref 0.0–0.2)

## 2023-04-11 LAB — URINALYSIS, ROUTINE W REFLEX MICROSCOPIC
Bilirubin Urine: NEGATIVE
Glucose, UA: NEGATIVE mg/dL
Hgb urine dipstick: NEGATIVE
Ketones, ur: NEGATIVE mg/dL
Nitrite: NEGATIVE
Protein, ur: NEGATIVE mg/dL
Specific Gravity, Urine: 1.009 (ref 1.005–1.030)
pH: 6 (ref 5.0–8.0)

## 2023-04-11 MED ORDER — CYCLOBENZAPRINE HCL 5 MG PO TABS: 5.0000 mg | ORAL_TABLET | Freq: Three times a day (TID) | ORAL | 0 refills | Status: DC | PRN

## 2023-04-11 MED ORDER — ACETAMINOPHEN 500 MG PO TABS
1000.0000 mg | ORAL_TABLET | Freq: Once | ORAL | Status: AC
  Administered 2023-04-11: 1000 mg via ORAL
  Filled 2023-04-11: qty 2

## 2023-04-11 MED ORDER — CYCLOBENZAPRINE HCL 5 MG PO TABS
10.0000 mg | ORAL_TABLET | Freq: Once | ORAL | Status: AC
  Administered 2023-04-11: 10 mg via ORAL
  Filled 2023-04-11: qty 2

## 2023-04-11 NOTE — Discharge Instructions (Signed)

## 2023-04-11 NOTE — MAU Provider Note (Signed)
History     CSN: [id number]  Arrival date and time: 04/11/23 0021   Event Date/Time   First Provider Initiated Contact with Patient 04/11/23 0058      Chief Complaint  Patient presents with   Abdominal Pain   Chelsea Armstrong , a  22 y.o. G2P1001 at [redacted]w[redacted]d presents to MAU with complaints of right sided hip and back pain. Patient reports that she started feeling sharp pain that shoots down into her right hip about 9am this morning. States that pain comes and goes and makes it difficult to walk. She states pain is worsened by movement and states since 9pm it has started to radiate over to her back. She states that its a dull uncomfortable feeling when she is not moving. Hurts to walk, roll over and sit down. States pain was unrelieved by 2 "tylenol or Asprin, unsure really" this morning, but has not taken anything since. She states that the pain went away for a while and she didn't think anything of it, but came back after getting up out of bed.          OB History     Gravida  2   Para  1   Term  1   Preterm      AB      Living  1      SAB      IAB      Ectopic      Multiple  0   Live Births  1           Past Medical History:  Diagnosis Date   ADHD (attention deficit hyperactivity disorder)    Elevated liver enzymes    PUPP (pruritic urticarial papules and plaques of pregnancy)     Past Surgical History:  Procedure Laterality Date   HAND SURGERY Bilateral 06/2016    Family History  Adopted: Yes  Problem Relation Age of Onset   Bipolar disorder Father    Alcohol abuse Father    Drug abuse Father    Bipolar disorder Mother    Alcohol abuse Mother    Drug abuse Mother     Social History   Tobacco Use   Smoking status: Never   Smokeless tobacco: Never  Vaping Use   Vaping status: Former   Quit date: 11/21/2020   Substances: Nicotine  Substance Use Topics   Alcohol use: No   Drug use: Not Currently    Types: Marijuana    Allergies:   Allergies  Allergen Reactions   Ibuprofen     Can't use due to increased liver enzymes    Medications Prior to Admission  Medication Sig Dispense Refill Last Dose   Ferrous Fumarate 150 MG TABS Take 1 tablet (150 mg total) by mouth every other day. 30 tablet 3 04/10/2023   ondansetron (ZOFRAN) 4 MG tablet Take 1 tablet (4 mg total) by mouth every 8 (eight) hours as needed for nausea or vomiting. 20 tablet 6 04/10/2023   Prenatal Vit-Fe Fumarate-FA (PRENATAL MULTIVITAMIN) TABS tablet Take 1 tablet by mouth daily at 12 noon.   Past Week    Review of Systems  Constitutional:  Negative for chills, fatigue and fever.  Eyes:  Negative for pain and visual disturbance.  Respiratory:  Negative for apnea, shortness of breath and wheezing.   Cardiovascular:  Negative for chest pain and palpitations.  Gastrointestinal:  Negative for abdominal pain, constipation, diarrhea, nausea and vomiting.  Genitourinary:  Positive for pelvic pain.  Negative for difficulty urinating, dysuria, vaginal bleeding, vaginal discharge and vaginal pain.  Musculoskeletal:  Negative for back pain.  Neurological:  Negative for seizures, weakness and headaches.  Psychiatric/Behavioral:  Negative for suicidal ideas.    Physical Exam   Blood pressure 120/69, pulse 100, temperature 98 F (36.7 C), resp. rate 16, height 5' (1.524 m), weight 89.7 kg, last menstrual period 08/18/2022, SpO2 98%, not currently breastfeeding.  Physical Exam Vitals and nursing note reviewed.  Constitutional:      General: She is not in acute distress.    Appearance: Normal appearance.  HENT:     Head: Normocephalic.  Pulmonary:     Effort: Pulmonary effort is normal.  Abdominal:     Tenderness: There is abdominal tenderness in the right lower quadrant. There is no right CVA tenderness, left CVA tenderness or guarding. Negative signs include Murphy's sign.  Musculoskeletal:     Cervical back: Normal range of motion.  Skin:    General: Skin  is warm and dry.  Neurological:     Mental Status: She is alert and oriented to person, place, and time.  Psychiatric:        Mood and Affect: Mood normal.    FHT: 150 bpm with moderate variability, Accels present no decels  Toco: quiet   MAU Course  Procedures Orders Placed This Encounter  Procedures   Urinalysis, Routine w reflex microscopic -Urine, Clean Catch   CBC with Differential/Platelet   Results for orders placed or performed during the hospital encounter of 04/11/23 (from the past 24 hour(s))  Urinalysis, Routine w reflex microscopic -Urine, Clean Catch     Status: Abnormal   Collection Time: 04/11/23 12:45 AM  Result Value Ref Range   Color, Urine YELLOW YELLOW   APPearance HAZY (A) CLEAR   Specific Gravity, Urine 1.009 1.005 - 1.030   pH 6.0 5.0 - 8.0   Glucose, UA NEGATIVE NEGATIVE mg/dL   Hgb urine dipstick NEGATIVE NEGATIVE   Bilirubin Urine NEGATIVE NEGATIVE   Ketones, ur NEGATIVE NEGATIVE mg/dL   Protein, ur NEGATIVE NEGATIVE mg/dL   Nitrite NEGATIVE NEGATIVE   Leukocytes,Ua SMALL (A) NEGATIVE   RBC / HPF 0-5 0 - 5 RBC/hpf   WBC, UA 11-20 0 - 5 WBC/hpf   Bacteria, UA FEW (A) NONE SEEN   Squamous Epithelial / HPF 11-20 0 - 5 /HPF   Mucus PRESENT   CBC with Differential/Platelet     Status: Abnormal   Collection Time: 04/11/23  1:28 AM  Result Value Ref Range   WBC 10.7 (H) 4.0 - 10.5 K/uL   RBC 4.28 3.87 - 5.11 MIL/uL   Hemoglobin 9.8 (L) 12.0 - 15.0 g/dL   HCT 16.1 (L) 09.6 - 04.5 %   MCV 75.0 (L) 80.0 - 100.0 fL   MCH 22.9 (L) 26.0 - 34.0 pg   MCHC 30.5 30.0 - 36.0 g/dL   RDW 40.9 (H) 81.1 - 91.4 %   Platelets 274 150 - 400 K/uL   nRBC 0.0 0.0 - 0.2 %   Neutrophils Relative % 68 %   Neutro Abs 7.3 1.7 - 7.7 K/uL   Lymphocytes Relative 22 %   Lymphs Abs 2.4 0.7 - 4.0 K/uL   Monocytes Relative 8 %   Monocytes Absolute 0.9 0.1 - 1.0 K/uL   Eosinophils Relative 1 %   Eosinophils Absolute 0.1 0.0 - 0.5 K/uL   Basophils Relative 0 %   Basophils  Absolute 0.0 0.0 - 0.1 K/uL   Immature  Granulocytes 1 %   Abs Immature Granulocytes 0.06 0.00 - 0.07 K/uL   Dilation: 1.5 Effacement (%): 70 Cervical Position: Middle Station: -2 Exam by:: Dorathy Daft CNM  MDM - CBC ordered - PO Tylenol and Flexeril ordered.  - Pain relieved with PO Tylenol and flexeril likely related to round ligament pain.  - plan for discharge,   Assessment and Plan   1. Round ligament pain   2. [redacted] weeks gestation of pregnancy   3. RLQ abdominal pain    - Reviewed Round ligament pain as a normal discomfort of pregnancy.  - Recommended Comfort measures ans stretches to be preformed. Also encouraged the use of a maternity support belt.  - Reviewed worsening signs and return precautions.  - FHT appropriate for gestational age at time of discharge.  - Patient discharged home in stable condition and may return to MAU as needed.   Claudette Head, MSN CNM  04/11/2023, 12:59 AM

## 2023-04-11 NOTE — MAU Note (Signed)
.  Chelsea Armstrong is a 22 y.o. at [redacted]w[redacted]d here in MAU reporting: Saturday had pain in right side lasted from 0700-1500, but it stopped. Tonight pain came back, but more is more intense and making it hard to walk. States pain is sharp and has been coming and going since 2200 - now wrapping around towards her back. Denies VB or LOF. +FM   Onset of complaint: 2200 Pain score: 8 Vitals:   04/11/23 0037  BP: 120/69  Pulse: 100  Resp: 16  Temp: 98 F (36.7 C)  SpO2: 98%     FHT:152 Lab orders placed from triage:  UA

## 2023-04-12 ENCOUNTER — Inpatient Hospital Stay (HOSPITAL_COMMUNITY)
Admission: AD | Admit: 2023-04-12 | Discharge: 2023-04-13 | Disposition: A | Discharge status: Home / Self Care | Payer: Medicaid Other | Attending: Obstetrics and Gynecology | Admitting: Obstetrics and Gynecology

## 2023-04-12 DIAGNOSIS — Z3A34 34 weeks gestation of pregnancy: Secondary | ICD-10-CM | POA: Insufficient documentation

## 2023-04-12 DIAGNOSIS — O26893 Other specified pregnancy related conditions, third trimester: Secondary | ICD-10-CM | POA: Insufficient documentation

## 2023-04-12 DIAGNOSIS — R102 Pelvic and perineal pain: Secondary | ICD-10-CM | POA: Insufficient documentation

## 2023-04-12 DIAGNOSIS — O4703 False labor before 37 completed weeks of gestation, third trimester: Secondary | ICD-10-CM | POA: Diagnosis not present

## 2023-04-12 DIAGNOSIS — O0932 Supervision of pregnancy with insufficient antenatal care, second trimester: Secondary | ICD-10-CM

## 2023-04-12 DIAGNOSIS — M549 Dorsalgia, unspecified: Secondary | ICD-10-CM | POA: Insufficient documentation

## 2023-04-12 DIAGNOSIS — N949 Unspecified condition associated with female genital organs and menstrual cycle: Secondary | ICD-10-CM | POA: Diagnosis not present

## 2023-04-12 LAB — CBC
HCT: 31.2 % — ABNORMAL LOW (ref 36.0–46.0)
Hemoglobin: 9.9 g/dL — ABNORMAL LOW (ref 12.0–15.0)
MCH: 23.7 pg — ABNORMAL LOW (ref 26.0–34.0)
MCHC: 31.7 g/dL (ref 30.0–36.0)
MCV: 74.6 fL — ABNORMAL LOW (ref 80.0–100.0)
Platelets: 284 10*3/uL (ref 150–400)
RBC: 4.18 MIL/uL (ref 3.87–5.11)
RDW: 16 % — ABNORMAL HIGH (ref 11.5–15.5)
WBC: 10.5 10*3/uL (ref 4.0–10.5)
nRBC: 0 % (ref 0.0–0.2)

## 2023-04-12 LAB — URINALYSIS, ROUTINE W REFLEX MICROSCOPIC
Bilirubin Urine: NEGATIVE
Glucose, UA: NEGATIVE mg/dL
Hgb urine dipstick: NEGATIVE
Ketones, ur: NEGATIVE mg/dL
Nitrite: POSITIVE — AB
Protein, ur: NEGATIVE mg/dL
Specific Gravity, Urine: 1.013 (ref 1.005–1.030)
pH: 6 (ref 5.0–8.0)

## 2023-04-12 LAB — COMPREHENSIVE METABOLIC PANEL
ALT: 9 U/L (ref 0–44)
AST: 15 U/L (ref 15–41)
Albumin: 2.5 g/dL — ABNORMAL LOW (ref 3.5–5.0)
Alkaline Phosphatase: 100 U/L (ref 38–126)
Anion gap: 9 (ref 5–15)
BUN: 5 mg/dL — ABNORMAL LOW (ref 6–20)
CO2: 23 mmol/L (ref 22–32)
Calcium: 9.3 mg/dL (ref 8.9–10.3)
Chloride: 104 mmol/L (ref 98–111)
Creatinine, Ser: 0.4 mg/dL — ABNORMAL LOW (ref 0.44–1.00)
GFR, Estimated: >60 mL/min (ref >60)
Glucose, Bld: 104 mg/dL — ABNORMAL HIGH (ref 70–99)
Potassium: 3.4 mmol/L — ABNORMAL LOW (ref 3.5–5.1)
Sodium: 136 mmol/L (ref 135–145)
Total Bilirubin: 0.4 mg/dL (ref <1.2)
Total Protein: 5.9 g/dL — ABNORMAL LOW (ref 6.5–8.1)

## 2023-04-12 MED ORDER — TERBUTALINE SULFATE 1 MG/ML IJ SOLN
0.2500 mg | Freq: Once | INTRAMUSCULAR | Status: AC
  Administered 2023-04-12: 0.25 mg via SUBCUTANEOUS
  Filled 2023-04-12: qty 1

## 2023-04-12 MED ORDER — ONDANSETRON 4 MG PO TBDP
4.0000 mg | ORAL_TABLET | Freq: Once | ORAL | Status: AC
  Administered 2023-04-12: 4 mg via ORAL
  Filled 2023-04-12: qty 1

## 2023-04-12 MED ORDER — LACTATED RINGERS IV SOLN: Freq: Once | INTRAVENOUS | Status: AC

## 2023-04-12 MED ORDER — NIFEDIPINE 10 MG PO CAPS
10.0000 mg | ORAL_CAPSULE | ORAL | Status: AC | PRN
Start: 2023-04-12 — End: 2023-04-12
  Administered 2023-04-12 (×4): 10 mg via ORAL
  Filled 2023-04-12 (×4): qty 1

## 2023-04-12 NOTE — MAU Provider Note (Signed)
Chief Complaint:  Abdominal Pain   Event Date/Time   First Provider Initiated Contact with Patient 04/12/23 2021     HPI: Chelsea Armstrong is a 22 y.o. G2P1001 at 45w6dwho presents to maternity admissions reporting abdominal and back pain that comes and goes.  Was seen earlier this week and treated with Flexeril which does not help pain tonight. . She reports good fetal movement, denies LOF, vaginal bleeding, urinary symptoms, n/v, diarrhea, constipation or fever/chills.    Abdominal Pain This is a recurrent problem. The current episode started today. The problem occurs intermittently. The quality of the pain is described as cramping. The pain radiates to the back. Pertinent negatives include no constipation, diarrhea, dysuria, fever or headaches. Nothing relieves the symptoms. Treatments tried: Flexeril. The treatment provided no improvement relief.   RN Note: JAMONI KENNAN is a 22 y.o. at [redacted]w[redacted]d here in MAU reporting LLQ pain that goes to the top and across her abdomen. States she was here earlier this wk for pain and the medicine helped at first but not today. Denies VB or LOF. States FM today has not been as much as usual but has felt FM   Onset of complaint: couple days Pain score: 8  Past Medical History: Past Medical History:  Diagnosis Date   ADHD (attention deficit hyperactivity disorder)    Elevated liver enzymes    PUPP (pruritic urticarial papules and plaques of pregnancy)     Past obstetric history: OB History  Gravida Para Term Preterm AB Living  2 1 1     1   SAB IAB Ectopic Multiple Live Births        0 1    # Outcome Date GA Lbr Len/2nd Weight Sex Type Anes PTL Lv  2 Current           1 Term 07/30/21 [redacted]w[redacted]d 15:02 / 04:23 4360 g M Vag-Vacuum EPI  LIV     Birth Comments: abrasion noted where vacuum was placed    Past Surgical History: Past Surgical History:  Procedure Laterality Date   HAND SURGERY Bilateral 06/2016    Family History: Family History  Adopted:  Yes  Problem Relation Age of Onset   Bipolar disorder Father    Alcohol abuse Father    Drug abuse Father    Bipolar disorder Mother    Alcohol abuse Mother    Drug abuse Mother     Social History: Social History   Tobacco Use   Smoking status: Never   Smokeless tobacco: Never  Vaping Use   Vaping status: Former   Quit date: 11/21/2020   Substances: Nicotine  Substance Use Topics   Alcohol use: No   Drug use: Not Currently    Types: Marijuana    Allergies:  Allergies  Allergen Reactions   Ibuprofen     Can't use due to increased liver enzymes    Meds:  Medications Prior to Admission  Medication Sig Dispense Refill Last Dose   cyclobenzaprine (FLEXERIL) 5 MG tablet Take 1 tablet (5 mg total) by mouth 3 (three) times daily as needed for muscle spasms. 20 tablet 0 04/12/2023 at 1400   Ferrous Fumarate 150 MG TABS Take 1 tablet (150 mg total) by mouth every other day. 30 tablet 3 04/11/2023   ondansetron (ZOFRAN) 4 MG tablet Take 1 tablet (4 mg total) by mouth every 8 (eight) hours as needed for nausea or vomiting. 20 tablet 6 Past Month   Prenatal Vit-Fe Fumarate-FA (PRENATAL MULTIVITAMIN) TABS tablet  Take 1 tablet by mouth daily at 12 noon.   04/12/2023    I have reviewed patient's Past Medical Hx, Surgical Hx, Family Hx, Social Hx, medications and allergies.   ROS:  Review of Systems  Constitutional:  Negative for fever.  Gastrointestinal:  Positive for abdominal pain. Negative for constipation and diarrhea.  Genitourinary:  Negative for dysuria.  Neurological:  Negative for headaches.   Other systems negative  Physical Exam  Patient Vitals for the past 24 hrs:  BP Temp Pulse Resp SpO2 Height Weight  04/12/23 1952 119/69 -- -- -- -- -- --  04/12/23 1948 -- 98 F (36.7 C) (!) 106 17 98 % 5' (1.524 m) 90.7 kg   Constitutional: Well-developed, well-nourished female in no acute distress.  Cardiovascular: normal rate  Respiratory: normal effort GI: Abd soft,  non-tender, gravid appropriate for gestational age.   No rebound or guarding. MS: Extremities nontender, no edema, normal ROM Neurologic: Alert and oriented x 4.  GU: Neg CVAT.  PELVIC EXAM: Dilation: 2 Effacement (%): 70 Cervical Position: Posterior Station: Ballotable Presentation: Vertex Exam by:: Wynelle Bourgeois CNM  No change on repeat exam    FHT:  Baseline 140 , moderate variability, accelerations present, no decelerations Contractions: q 3 mins Irregular     Labs: Results for orders placed or performed during the hospital encounter of 04/12/23 (from the past 24 hour(s))  Urinalysis, Routine w reflex microscopic -Urine, Clean Catch     Status: Abnormal   Collection Time: 04/12/23  8:13 PM  Result Value Ref Range   Color, Urine YELLOW YELLOW   APPearance HAZY (A) CLEAR   Specific Gravity, Urine 1.013 1.005 - 1.030   pH 6.0 5.0 - 8.0   Glucose, UA NEGATIVE NEGATIVE mg/dL   Hgb urine dipstick NEGATIVE NEGATIVE   Bilirubin Urine NEGATIVE NEGATIVE   Ketones, ur NEGATIVE NEGATIVE mg/dL   Protein, ur NEGATIVE NEGATIVE mg/dL   Nitrite POSITIVE (A) NEGATIVE   Leukocytes,Ua TRACE (A) NEGATIVE   RBC / HPF 0-5 0 - 5 RBC/hpf   WBC, UA 11-20 0 - 5 WBC/hpf   Bacteria, UA FEW (A) NONE SEEN   Squamous Epithelial / HPF 6-10 0 - 5 /HPF   Mucus PRESENT   CBC     Status: Abnormal   Collection Time: 04/12/23  8:41 PM  Result Value Ref Range   WBC 10.5 4.0 - 10.5 K/uL   RBC 4.18 3.87 - 5.11 MIL/uL   Hemoglobin 9.9 (L) 12.0 - 15.0 g/dL   HCT 91.4 (L) 78.2 - 95.6 %   MCV 74.6 (L) 80.0 - 100.0 fL   MCH 23.7 (L) 26.0 - 34.0 pg   MCHC 31.7 30.0 - 36.0 g/dL   RDW 21.3 (H) 08.6 - 57.8 %   Platelets 284 150 - 400 K/uL   nRBC 0.0 0.0 - 0.2 %  Comprehensive metabolic panel     Status: Abnormal   Collection Time: 04/12/23  8:41 PM  Result Value Ref Range   Sodium 136 135 - 145 mmol/L   Potassium 3.4 (L) 3.5 - 5.1 mmol/L   Chloride 104 98 - 111 mmol/L   CO2 23 22 - 32 mmol/L   Glucose,  Bld 104 (H) 70 - 99 mg/dL   BUN 5 (L) 6 - 20 mg/dL   Creatinine, Ser 4.69 (L) 0.44 - 1.00 mg/dL   Calcium 9.3 8.9 - 62.9 mg/dL   Total Protein 5.9 (L) 6.5 - 8.1 g/dL   Albumin 2.5 (L) 3.5 -  5.0 g/dL   AST 15 15 - 41 U/L   ALT 9 0 - 44 U/L   Alkaline Phosphatase 100 38 - 126 U/L   Total Bilirubin 0.4 <1.2 mg/dL   GFR, Estimated >38 >75 mL/min   Anion gap 9 5 - 15    O/Positive/-- (08/01 1146)  Imaging:  No results found.  MAU Course/MDM: I have reviewed the triage vital signs and the nursing notes.   Pertinent labs & imaging results that were available during my care of the patient were reviewed by me and considered in my medical decision making (see chart for details).      I have reviewed her medical records including past results, notes and treatments.   I have ordered labs and reviewed results.  NST reviewed Consult Dr Jolayne Panther with presentation, exam findings and test results.  Treatments in MAU included Procardia series which did not stop contractions.  Zofran given for nausea.  We then gave Terbutaline which did stop contractions.  She still had a lot of pain with movement along her left round ligament.  States support belt does not help  Offered pain med but is driving.  Declined Rx to be sent to pharmacy.  Discussed it is reassuring that her cervix did not change over time. .    Assessment: Single IUP at [redacted]w[redacted]d Preterm Labor Nitrites in urine  Round ligament pain.  Plan: Discharge home Urine to culture, not treated at this time Preterm labor precautions and fetal kick counts Follow up in Office for prenatal visits and recheck Encouraged to return if she develops worsening of symptoms, increase in pain, fever, or other concerning symptoms.   Pt stable at time of discharge.  Wynelle Bourgeois CNM, MSN Certified Nurse-Midwife 04/12/2023 8:21 PM

## 2023-04-12 NOTE — MAU Note (Signed)
.  Chelsea Armstrong is a 22 y.o. at [redacted]w[redacted]d here in MAU reporting LLQ pain that goes to the top and across her abdomen. States she was here earlier this wk for pain and the medicine helped at first but not today. Denies VB or LOF. States FM today has not been as much as usual but has felt FM  Onset of complaint: couple days Pain score: 8 Vitals:   04/12/23 1948 04/12/23 1952  BP:  119/69  Pulse: (!) 106   Resp: 17   Temp: 98 F (36.7 C)   SpO2: 98%      FHT:153 Lab orders placed from triage:  u/a

## 2023-04-13 ENCOUNTER — Encounter: Payer: Self-pay | Admitting: Advanced Practice Midwife

## 2023-04-13 ENCOUNTER — Ambulatory Visit (INDEPENDENT_AMBULATORY_CARE_PROVIDER_SITE_OTHER): Payer: Medicaid Other | Admitting: Advanced Practice Midwife

## 2023-04-13 VITALS — BP 137/66 | HR 93 | Wt 198.0 lb

## 2023-04-13 DIAGNOSIS — N949 Unspecified condition associated with female genital organs and menstrual cycle: Secondary | ICD-10-CM

## 2023-04-13 DIAGNOSIS — Z3A34 34 weeks gestation of pregnancy: Secondary | ICD-10-CM

## 2023-04-13 DIAGNOSIS — Z3483 Encounter for supervision of other normal pregnancy, third trimester: Secondary | ICD-10-CM

## 2023-04-13 DIAGNOSIS — Z3480 Encounter for supervision of other normal pregnancy, unspecified trimester: Secondary | ICD-10-CM

## 2023-04-13 LAB — CULTURE, OB URINE

## 2023-04-13 NOTE — Progress Notes (Signed)
LOW-RISK PREGNANCY VISIT Patient name: Chelsea Armstrong MRN [id number]  Date of birth: 2001-05-02 Chief Complaint:   Routine Prenatal Visit (Seem MAU last night, contraction, given medication stop contraction. 2cm dilated)  History of Present Illness:   Chelsea Armstrong is a 22 y.o. G62P1001 female at [redacted]w[redacted]d with an Estimated Date of Delivery: 05/25/23 being seen today for ongoing management of a low-risk pregnancy.  Today she reports  being seen in MAU last PM for ctx; got home at 0300 and just work up prior to appt- is doing better but still with round lig pain . Contractions: Irregular.  .  Movement: Present. denies leaking of fluid. Review of Systems:   Pertinent items are noted in HPI Denies abnormal vaginal discharge w/ itching/odor/irritation, headaches, visual changes, shortness of breath, chest pain, abdominal pain, severe nausea/vomiting, or problems with urination or bowel movements unless otherwise stated above. Pertinent History Reviewed:  Reviewed past medical,surgical, social, obstetrical and family history.  Reviewed problem list, medications and allergies. Physical Assessment:   Vitals:   04/13/23 1158  BP: 137/66  Pulse: 93  Weight: 198 lb (89.8 kg)  Body mass index is 38.67 kg/m.        Physical Examination:   General appearance: Well appearing, and in no distress  Mental status: Alert, oriented to person, place, and time  Skin: Warm & dry  Cardiovascular: Normal heart rate noted  Respiratory: Normal respiratory effort, no distress  Abdomen: Soft, gravid, nontender  Pelvic: Cervical exam deferred         Extremities: Edema: None  Fetal Status: Fetal Heart Rate (bpm): 143 Fundal Height: 34 cm Movement: Present    Results for orders placed or performed during the hospital encounter of 04/12/23 (from the past 24 hour(s))  Urinalysis, Routine w reflex microscopic -Urine, Clean Catch   Collection Time: 04/12/23  8:13 PM  Result Value Ref Range   Color, Urine YELLOW  YELLOW   APPearance HAZY (A) CLEAR   Specific Gravity, Urine 1.013 1.005 - 1.030   pH 6.0 5.0 - 8.0   Glucose, UA NEGATIVE NEGATIVE mg/dL   Hgb urine dipstick NEGATIVE NEGATIVE   Bilirubin Urine NEGATIVE NEGATIVE   Ketones, ur NEGATIVE NEGATIVE mg/dL   Protein, ur NEGATIVE NEGATIVE mg/dL   Nitrite POSITIVE (A) NEGATIVE   Leukocytes,Ua TRACE (A) NEGATIVE   RBC / HPF 0-5 0 - 5 RBC/hpf   WBC, UA 11-20 0 - 5 WBC/hpf   Bacteria, UA FEW (A) NONE SEEN   Squamous Epithelial / HPF 6-10 0 - 5 /HPF   Mucus PRESENT   CBC   Collection Time: 04/12/23  8:41 PM  Result Value Ref Range   WBC 10.5 4.0 - 10.5 K/uL   RBC 4.18 3.87 - 5.11 MIL/uL   Hemoglobin 9.9 (L) 12.0 - 15.0 g/dL   HCT 72.5 (L) 36.6 - 44.0 %   MCV 74.6 (L) 80.0 - 100.0 fL   MCH 23.7 (L) 26.0 - 34.0 pg   MCHC 31.7 30.0 - 36.0 g/dL   RDW 34.7 (H) 42.5 - 95.6 %   Platelets 284 150 - 400 K/uL   nRBC 0.0 0.0 - 0.2 %  Comprehensive metabolic panel   Collection Time: 04/12/23  8:41 PM  Result Value Ref Range   Sodium 136 135 - 145 mmol/L   Potassium 3.4 (L) 3.5 - 5.1 mmol/L   Chloride 104 98 - 111 mmol/L   CO2 23 22 - 32 mmol/L   Glucose, Bld 104 (H) 70 -  99 mg/dL   BUN 5 (L) 6 - 20 mg/dL   Creatinine, Ser 3.47 (L) 0.44 - 1.00 mg/dL   Calcium 9.3 8.9 - 42.5 mg/dL   Total Protein 5.9 (L) 6.5 - 8.1 g/dL   Albumin 2.5 (L) 3.5 - 5.0 g/dL   AST 15 15 - 41 U/L   ALT 9 0 - 44 U/L   Alkaline Phosphatase 100 38 - 126 U/L   Total Bilirubin 0.4 <1.2 mg/dL   GFR, Estimated >95 >63 mL/min   Anion gap 9 5 - 15    Assessment & Plan:  1) Low-risk pregnancy G2P1001 at [redacted]w[redacted]d with an Estimated Date of Delivery: 05/25/23   2) 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  Labs/procedures today: none (ask re Tdap/flu next  visit)  Plan:  Continue routine obstetrical care   Reviewed: Preterm labor symptoms and general obstetric precautions including but not limited to vaginal bleeding, contractions, leaking of fluid and fetal movement were reviewed in detail with the patient.  All questions were answered. Doesn't have home bp cuff.   Follow-up: Return for As scheduled.  No orders of the defined types were placed in this encounter.  Arabella Merles CNM 04/13/2023 12:16 PM

## 2023-04-13 NOTE — MAU Note (Signed)
Pt requested Flexeril before d/c home. Although she has Flexeril at home she states she is not going home. However, she drove herself and did not have anyone to come get her so she decided to leave after d/c

## 2023-04-25 MED ORDER — CYCLOBENZAPRINE HCL 10 MG PO TABS: 10.0000 mg | ORAL_TABLET | Freq: Three times a day (TID) | ORAL | 1 refills | Status: DC | PRN

## 2023-04-25 NOTE — Addendum Note (Signed)
Addended by: Lazaro Arms on: 04/25/2023 04:48 PM   Modules accepted: Orders

## 2023-04-27 ENCOUNTER — Ambulatory Visit: Payer: Medicaid Other | Admitting: Radiology

## 2023-04-27 ENCOUNTER — Ambulatory Visit (INDEPENDENT_AMBULATORY_CARE_PROVIDER_SITE_OTHER): Payer: Medicaid Other | Admitting: Advanced Practice Midwife

## 2023-04-27 ENCOUNTER — Other Ambulatory Visit: Payer: Self-pay | Admitting: Advanced Practice Midwife

## 2023-04-27 ENCOUNTER — Encounter: Payer: Self-pay | Admitting: Advanced Practice Midwife

## 2023-04-27 ENCOUNTER — Other Ambulatory Visit (HOSPITAL_COMMUNITY)
Admission: RE | Admit: 2023-04-27 | Discharge: 2023-04-27 | Disposition: A | Discharge status: Home / Self Care | Payer: Medicaid Other | Source: Ambulatory Visit | Attending: Advanced Practice Midwife | Admitting: Advanced Practice Midwife

## 2023-04-27 VITALS — BP 119/78 | HR 119 | Wt 198.0 lb

## 2023-04-27 DIAGNOSIS — Z3483 Encounter for supervision of other normal pregnancy, third trimester: Secondary | ICD-10-CM | POA: Insufficient documentation

## 2023-04-27 DIAGNOSIS — Z3480 Encounter for supervision of other normal pregnancy, unspecified trimester: Secondary | ICD-10-CM

## 2023-04-27 DIAGNOSIS — Z23 Encounter for immunization: Secondary | ICD-10-CM

## 2023-04-27 DIAGNOSIS — Z3A36 36 weeks gestation of pregnancy: Secondary | ICD-10-CM | POA: Insufficient documentation

## 2023-04-27 DIAGNOSIS — O3663X0 Maternal care for excessive fetal growth, third trimester, not applicable or unspecified: Secondary | ICD-10-CM

## 2023-04-27 DIAGNOSIS — Z8759 Personal history of other complications of pregnancy, childbirth and the puerperium: Secondary | ICD-10-CM

## 2023-04-27 DIAGNOSIS — Z3493 Encounter for supervision of normal pregnancy, unspecified, third trimester: Secondary | ICD-10-CM | POA: Diagnosis not present

## 2023-04-27 NOTE — Progress Notes (Signed)
GA [redacted] weeks Single active female fetus,  cephalic FHR = 157 bpm  anterior pl, gr1   AFI = 23.9 cm  93% SVP = 6.8 cm   EFW 94.7% AC 98.8% 3420g  BPP = 8/8 Nl R ov - L ov not seen - neg adnexal regions  -  Cx appears closed

## 2023-04-27 NOTE — Progress Notes (Signed)
   LOW-RISK PREGNANCY VISIT Patient name: Chelsea Armstrong MRN [id number]  Date of birth: 2001/04/15 Chief Complaint:   Routine Prenatal Visit (culture) and Pregnancy Ultrasound (Tdap,flu shot. Has nasal congestion .)  History of Present Illness:   Chelsea Armstrong is a 22 y.o. G7P1001 female at [redacted]w[redacted]d with an Estimated Date of Delivery: 05/25/23 being seen today for ongoing management of a low-risk pregnancy.  Today she reports  having a lot of rib and back pain; Flexeril isn't helping . Contractions: Not present.  .  Movement: Present. denies leaking of fluid. Review of Systems:   Pertinent items are noted in HPI Denies abnormal vaginal discharge w/ itching/odor/irritation, headaches, visual changes, shortness of breath, chest pain, abdominal pain, severe nausea/vomiting, or problems with urination or bowel movements unless otherwise stated above. Pertinent History Reviewed:  Reviewed past medical,surgical, social, obstetrical and family history.  Reviewed problem list, medications and allergies. Physical Assessment:   Vitals:   04/27/23 1130  BP: 119/78  Pulse: (!) 119  Weight: 198 lb (89.8 kg)  Body mass index is 38.67 kg/m.        Physical Examination:   General appearance: Well appearing, and in no distress  Mental status: Alert, oriented to person, place, and time  Skin: Warm & dry  Cardiovascular: Normal heart rate noted  Respiratory: Normal respiratory effort, no distress  Abdomen: Soft, gravid, nontender  Pelvic: Cervical exam performed  Dilation: 1 Effacement (%): 50 Station: -3  Extremities: Edema: None  Fetal Status: Fetal Heart Rate (bpm): 157 u/s   Movement: Present Presentation: Vertex  Growth u/s: GA [redacted] weeks Single active female fetus,  cephalic FHR = 157 bpm  anterior pl, gr1   AFI = 23.9 cm  93% SVP = 6.8 cm   EFW 94.7% AC 98.8% 3420g  BPP = 8/8 Nl R ov - L ov not seen - neg adnexal regions  -  Cx appears closed  No results found for this or any previous visit (from  the past 24 hours).  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  Labs/procedures today: growth u/s; GBS/cultures; SVE; Tdap and flu given  Plan:  Continue routine obstetrical care   Reviewed: Term labor symptoms and general obstetric precautions including but not limited to vaginal bleeding, contractions, leaking of fluid and fetal movement were reviewed in detail with the patient.  All questions were answered. Has home bp cuff. Check bp weekly, let us know if >140/90.   Follow-up: Return for As scheduled.  Orders Placed This Encounter  Procedures   Culture, beta strep (group b only)   Tdap vaccine greater than or equal to 7yo IM   Flu vaccine trivalent PF, 6mos and older(Flulaval,Afluria,Fluarix,Fluzone)   Arabella Merles CNM 04/27/2023 12:22 PM

## 2023-04-28 LAB — CERVICOVAGINAL ANCILLARY ONLY
Chlamydia: NEGATIVE
Comment: NEGATIVE
Comment: NORMAL
Neisseria Gonorrhea: NEGATIVE

## 2023-04-30 ENCOUNTER — Encounter (HOSPITAL_COMMUNITY): Payer: Self-pay | Admitting: Obstetrics and Gynecology

## 2023-04-30 ENCOUNTER — Inpatient Hospital Stay (HOSPITAL_COMMUNITY)
Admission: AD | Admit: 2023-04-30 | Discharge: 2023-04-30 | Disposition: A | Discharge status: Home / Self Care | Payer: Medicaid Other | Attending: Obstetrics and Gynecology | Admitting: Obstetrics and Gynecology

## 2023-04-30 DIAGNOSIS — Z3480 Encounter for supervision of other normal pregnancy, unspecified trimester: Secondary | ICD-10-CM

## 2023-04-30 DIAGNOSIS — Z3A36 36 weeks gestation of pregnancy: Secondary | ICD-10-CM

## 2023-04-30 DIAGNOSIS — O0932 Supervision of pregnancy with insufficient antenatal care, second trimester: Secondary | ICD-10-CM

## 2023-04-30 DIAGNOSIS — O4703 False labor before 37 completed weeks of gestation, third trimester: Secondary | ICD-10-CM

## 2023-04-30 DIAGNOSIS — Z3689 Encounter for other specified antenatal screening: Secondary | ICD-10-CM

## 2023-04-30 NOTE — MAU Note (Signed)
Pt says she feels  abd tightening- [facility] 2 [facility]- at 34 weeks  Has H/A - started at 0030-7/10  took 2 Tab Tyle  at 0230 Feels like need to vomit

## 2023-04-30 NOTE — MAU Provider Note (Signed)
S: Ms. DANYELLA BENOIST is a 22 y.o. G2P1001 at [redacted]w[redacted]d  who presents to MAU today for labor evaluation.   After 4 hours patient significant cervical change.   Cervical exam by RN:  Dilation: 3 Effacement (%): 50 Cervical Position: Posterior Station: Ballotable Presentation: Vertex Exam by:: DCallaway, RN  Fetal Monitoring: Baseline: 135 Variability: Moderate Accelerations: Present 15x15 Decelerations: None Contractions: Q3-25min  MDM Discussed patient with RN. NST reviewed.   A: SIUP at [redacted]w[redacted]d  False labor Cat I FT  P: Discharge home Labor precautions and kick counts included in AVS Patient to follow-up with primary office  as scheduled  Patient may return to MAU as needed or when in labor   Gerrit Heck, [place] 04/30/2023 7:09 AM

## 2023-05-01 ENCOUNTER — Encounter: Payer: Self-pay | Admitting: Obstetrics & Gynecology

## 2023-05-01 LAB — CULTURE, BETA STREP (GROUP B ONLY): Strep Gp B Culture: NEGATIVE

## 2023-05-02 ENCOUNTER — Telehealth: Payer: Self-pay | Admitting: Obstetrics & Gynecology

## 2023-05-02 NOTE — Telephone Encounter (Signed)
-   called pt to review her concerns -discussed that since she is only [redacted]w[redacted]d there is "nothing we can do" -pt notes pelvic pressure, fatigue and headache -reviewed conservative options -should she note worsening of symptoms, elevated BPs or other acute changes pt advised to go to MAU -pt to f/u as scheduled on Friday -consider discussing IOL @ 39wk  Myna Hidalgo, DO Attending Obstetrician & Gynecologist, [facility] for [employer], [facility]

## 2023-05-05 ENCOUNTER — Encounter: Payer: Medicaid Other | Admitting: Obstetrics & Gynecology

## 2023-05-05 ENCOUNTER — Encounter: Payer: Self-pay | Admitting: Obstetrics & Gynecology

## 2023-05-06 MED ORDER — PREDNISONE 10 MG PO TABS: ORAL_TABLET | ORAL | 0 refills | Status: DC

## 2023-05-11 ENCOUNTER — Encounter: Payer: Self-pay | Admitting: Women's Health

## 2023-05-11 NOTE — L&D Delivery Note (Signed)
 Obstetrical Delivery Note   Date of Delivery:   05/20/2023 Primary OB:   [facility] Gestational Age/EDD: [redacted]w[redacted]d Reason for Admission: IOL for LGA Antepartum complications: h/o 4th degree  Delivered By:   [name] Md  Delivery Type:   spontaneous vaginal delivery  Delivery Details:   Patient pushed over two hours with poor maternal effort. Baby delivered without issue. Prior to delivery, very thin genital hiatus and hemorrhoids noted. Placenta delivered without issue. After delivery she had a button hole tear at the introitus of the posterior vaginal floor that was about 1.5cm from the vaginal introitus. Rectal exam done and I didn't feel this was a 4th degree but most likely edema from the the hemrrhoids. I didn't note anything that looked like rectal mucosa or anal sphincter on rectal. Using one finger in the rectum, I used a 3-0 vicryl close the layer closest to my finger in a running, non locking fashion taking care not to enter the space where my finger was in case this was a salient 4th degree. I then did a 2nd imbricating 3-0 vicryl, running non locking suture about this. She had a left sidewall repair done with 2-0. At the end of the repair, final rectal exam negative for any sutures. Patient received a prophylactic dose of Lysteda  and I also ordered a dose of ancef  2gm x 1 Anesthesia:    epidural Intrapartum complications: None GBS:    Negative Laceration:    2nd degree (see above) Episiotomy:    none Rectal exam:   Yes Placenta:    Delivered and expressed via active management. Intact: yes. To pathology: no.  Delayed Cord Clamping: yes Estimated Blood Loss:   Baby:    Liveborn female, APGARs 8/9, weight 4640gm  [name]. MD Attending Center for [employer] [facility])

## 2023-05-12 ENCOUNTER — Encounter: Payer: Self-pay | Admitting: Women's Health

## 2023-05-12 ENCOUNTER — Encounter (HOSPITAL_COMMUNITY): Payer: Self-pay | Admitting: *Deleted

## 2023-05-12 ENCOUNTER — Ambulatory Visit: Payer: Medicaid Other | Admitting: Women's Health

## 2023-05-12 ENCOUNTER — Telehealth (HOSPITAL_COMMUNITY): Payer: Self-pay | Admitting: *Deleted

## 2023-05-12 VITALS — BP 128/80 | HR 95 | Wt 201.6 lb

## 2023-05-12 DIAGNOSIS — Z3483 Encounter for supervision of other normal pregnancy, third trimester: Secondary | ICD-10-CM

## 2023-05-12 DIAGNOSIS — Z8759 Personal history of other complications of pregnancy, childbirth and the puerperium: Secondary | ICD-10-CM

## 2023-05-12 DIAGNOSIS — O26893 Other specified pregnancy related conditions, third trimester: Secondary | ICD-10-CM

## 2023-05-12 DIAGNOSIS — Z3A38 38 weeks gestation of pregnancy: Secondary | ICD-10-CM

## 2023-05-12 DIAGNOSIS — O3663X0 Maternal care for excessive fetal growth, third trimester, not applicable or unspecified: Secondary | ICD-10-CM

## 2023-05-12 DIAGNOSIS — Z3480 Encounter for supervision of other normal pregnancy, unspecified trimester: Secondary | ICD-10-CM

## 2023-05-12 DIAGNOSIS — R102 Pelvic and perineal pain: Secondary | ICD-10-CM

## 2023-05-12 NOTE — Addendum Note (Signed)
 Addended by: Cheral Marker on: 05/12/2023 12:54 PM   Modules accepted: Orders

## 2023-05-12 NOTE — Progress Notes (Signed)
 LOW-RISK PREGNANCY VISIT Patient name: Chelsea Armstrong MRN [id number]  Date of birth: January 18, 2001 Chief Complaint:   Routine Prenatal Visit (Cervical check. Took prednisone  for one day, made her sick.)  History of Present Illness:   Chelsea Armstrong is a 23 y.o. G13P1001 female at [redacted]w[redacted]d with an Estimated Date of Delivery: 05/25/23 being seen today for ongoing management of a low-risk pregnancy.   Today she reports  'done w/ it', in a lot of pain 'none of medicine is helping'. Dr. Jayne rx'd prednisone  on 12/27 but made her sick, so couldn't take it . Contractions: Irregular.  .  Movement: Present. denies leaking of fluid.     03/02/2023   10:02 AM 12/09/2022   10:32 AM 02/04/2021    3:40 PM 12/25/2020    3:45 PM  Depression screen PHQ 2/9  Decreased Interest 0 1 0 0  Down, Depressed, Hopeless 0 0 0 0  PHQ - 2 Score 0 1 0 0  Altered sleeping 0  2 1  Tired, decreased energy 1 3 1 2   Change in appetite 0 3 0 1  Feeling bad or failure about yourself  0 3 1 0  Trouble concentrating 0 0 0 0  Moving slowly or fidgety/restless 0 0 0 0  Suicidal thoughts 0 0 0 0  PHQ-9 Score 1  4 4         03/02/2023   10:03 AM 12/09/2022   10:32 AM 02/04/2021    3:40 PM 12/25/2020    3:46 PM  GAD 7 : Generalized Anxiety Score  Nervous, Anxious, on Edge 0 0 1 3  Control/stop worrying 0 0 1 2  Worry too much - different things 0 0 1 0  Trouble relaxing 0 2 0 0  Restless 0 0 0 2  Easily annoyed or irritable 1 3 1 1   Afraid - awful might happen 0 0 0 2  Total GAD 7 Score 1 5 4 10       Review of Systems:   Pertinent items are noted in HPI Denies abnormal vaginal discharge w/ itching/odor/irritation, headaches, visual changes, shortness of breath, chest pain, abdominal pain, severe nausea/vomiting, or problems with urination or bowel movements unless otherwise stated above. Pertinent History Reviewed:  Reviewed past medical,surgical, social, obstetrical and family history.  Reviewed problem list, medications  and allergies. Physical Assessment:   Vitals:   05/12/23 1202  BP: 128/80  Pulse: 95  Weight: 201 lb 9.6 oz (91.4 kg)  Body mass index is 39.37 kg/m.        Physical Examination:   General appearance: Well appearing, and in no distress  Mental status: Alert, oriented to person, place, and time  Skin: Warm & dry  Cardiovascular: Normal heart rate noted  Respiratory: Normal respiratory effort, no distress  Abdomen: Soft, gravid, nontender  Pelvic: Cervical exam performed  Dilation: 4.5 Effacement (%): 80 Station: -2  Extremities: Edema: None  Fetal Status: Fetal Heart Rate (bpm): 142 Fundal Height: 40 cm Movement: Present Presentation: Vertex  Chaperone: Peggy Dones   No results found for this or any previous visit (from the past 24 hours).  Assessment & Plan:  1) Low-risk pregnancy G2P1001 at [redacted]w[redacted]d with an Estimated Date of Delivery: 05/25/23   2) Severe pain pelvis/abd, ongoing, Dr. Jayne has been trying to manage, recommends IOL @ 39w  3) Suspected LGA w/ h/o 4th degree lac> EFW 3420g/94% at 36w   Meds: No orders of the defined types were placed in this  encounter.  Labs/procedures today: SVE  Plan:  IOL scheduled for 1/9 MN,  IOL form faxed and orders placed   Reviewed: Term labor symptoms and general obstetric precautions including but not limited to vaginal bleeding, contractions, leaking of fluid and fetal movement were reviewed in detail with the patient.  All questions were answered. Does have home bp cuff. Office bp cuff given: not applicable. Check bp weekly, let us  know if consistently >140 and/or >90.  Follow-up: Return for As scheduled.  Future Appointments  Date Time Provider Department Center  05/19/2023 12:00 AM [facility] SCHED ROOM [facility] None  05/19/2023 11:30 AM Newton Mering, CNM [facility]  05/26/2023 11:30 AM [facility] NURSE [facility]  05/26/2023 11:50 AM Kizzie Suzen SAUNDERS, CNM [facility]  06/01/2023  6:30 AM [facility] SCHED ROOM [facility]  None    No orders of the defined types were placed in this encounter.  Suzen SAUNDERS Kizzie CNM, [facility] 05/12/2023 12:44 PM

## 2023-05-12 NOTE — Telephone Encounter (Signed)
 Preadmission screen

## 2023-05-12 NOTE — Patient Instructions (Signed)
 Chelsea Armstrong, thank you for choosing our office today! We appreciate the opportunity to meet your healthcare needs. You may receive a short survey by mail, e-mail, or through [employer]. If you are happy with your care we would appreciate if you could take just a few minutes to complete the survey questions. We read all of your comments and take your feedback very seriously. Thank you again for choosing our office.  Center for [employer] Team at [facility]  [facility] at [facility] ([address] [place], [place] [address]) Entrance C, located off of [address] Free 24/7 valet parking   CLASSES: Go to [email or web address] to register for classes (childbirth, breastfeeding, waterbirth, infant CPR, daddy bootcamp, etc.)  Call the office [phone]) or go to [facility] if: You begin to have strong, frequent contractions Your water breaks.  Sometimes it is a big gush of fluid, sometimes it is just a trickle that keeps getting your panties wet or running down your legs You have vaginal bleeding.  It is normal to have a small amount of spotting if your cervix was checked.  You don't feel your baby moving like normal.  If you don't, get you something to eat and drink and lay down and focus on feeling your baby move.   If your baby is still not moving like normal, you should call the office or go to [facility].  Call the office [phone]) or go to [facility] hospital for these signs of pre-eclampsia: Severe headache that does not go away with Tylenol  Visual changes- seeing spots, double, blurred vision Pain under your right breast or upper abdomen that does not go away with Tums or heartburn medicine Nausea and/or vomiting Severe swelling in your hands, feet, and face   [place] Pediatricians/Family Doctors [facility]): [address] Luba BROCKS, [phone]           [facility]: [address], [phone]                 [facility]): [address], [phone] (call to ask if accepting patients) [facility] Department: [address], [place], [phone]    [facility] Pediatricians/Family Doctors [facility]): [address], [phone] [facility]: [address] [place], [phone] [facility] [place]: [address], [phone]  [facility] Doctors  [facility]): [phone] [facility]: [address] [phone]   [facility] Doctors [facility]: [address], [phone]  [facility] Doctors  [facility]: [phone], [phone]  Home Blood Pressure Monitoring for Patients   Your provider has recommended that you check your blood pressure (BP) at least once a week at home. If you do not have a blood pressure cuff at home, one will be provided for you. Contact your provider if you have not received your monitor within 1 week.   Helpful Tips for Accurate Home Blood Pressure Checks  Don't smoke, exercise, or drink caffeine 30 minutes before checking your BP Use the restroom before checking your BP (a full bladder can raise your pressure) Relax in a comfortable upright chair Feet on the ground Left arm resting comfortably on a flat surface at the level of your heart Legs uncrossed Back supported Sit quietly and don't talk Place the cuff on your bare arm Adjust snuggly, so that only two fingertips  can fit between your skin and the top of the cuff Check 2 readings separated by at least one minute Keep a log of your BP readings For a visual, please reference this diagram: http://ccnc.care/bpdiagram  Provider Name: [facility]     Phone: [phone]  Zone 1: ALL CLEAR  Continue to monitor your symptoms:  BP reading is less than 140 (top number) or less than 90 (bottom number)  No right  upper stomach pain No headaches or seeing spots No feeling nauseated or throwing up No swelling in face and hands  Zone 2: CAUTION Call your doctor's office for any of the following:  BP reading is greater than 140 (top number) or greater than 90 (bottom number)  Stomach pain under your ribs in the middle or right side Headaches or seeing spots Feeling nauseated or throwing up Swelling in face and hands  Zone 3: EMERGENCY  Seek immediate medical care if you have any of the following:  BP reading is greater than160 (top number) or greater than 110 (bottom number) Severe headaches not improving with Tylenol  Serious difficulty catching your breath Any worsening symptoms from Zone 2   Braxton Hicks Contractions Contractions of the uterus can occur throughout pregnancy, but they are not always a sign that you are in labor. You may have practice contractions called Braxton Hicks contractions. These false labor contractions are sometimes confused with true labor. What are Darol Irving contractions? Braxton Hicks contractions are tightening movements that occur in the muscles of the uterus before labor. Unlike true labor contractions, these contractions do not result in opening (dilation) and thinning of the cervix. Toward the end of pregnancy (32-34 weeks), Braxton Hicks contractions can happen more often and may become stronger. These contractions are sometimes difficult to tell apart from true labor because they can be very uncomfortable. You should not feel embarrassed if you go to the hospital with false labor. Sometimes, the only way to tell if you are in true labor is for your health care provider to look for changes in the cervix. The health care provider will do a physical exam and may monitor your contractions. If you are not in true labor, the exam should show that your cervix is not dilating and your water has not broken. If there are no other health problems associated with your  pregnancy, it is completely safe for you to be sent home with false labor. You may continue to have Braxton Hicks contractions until you go into true labor. How to tell the difference between true labor and false labor True labor Contractions last 30-70 seconds. Contractions become very regular. Discomfort is usually felt in the top of the uterus, and it spreads to the lower abdomen and low back. Contractions do not go away with walking. Contractions usually become more intense and increase in frequency. The cervix dilates and gets thinner. False labor Contractions are usually shorter and not as strong as true labor contractions. Contractions are usually irregular. Contractions are often felt in the front of the lower abdomen and in the groin. Contractions may go away when you walk around or change positions while lying down. Contractions get weaker and are shorter-lasting as time goes on. The cervix usually does not dilate or become thin. Follow these instructions at home:  Take over-the-counter and prescription medicines only as told by your health care provider. Keep up with your usual exercises and follow other instructions from your health care provider. Eat and drink lightly if you think  you are going into labor. If Braxton Hicks contractions are making you uncomfortable: Change your position from lying down or resting to walking, or change from walking to resting. Sit and rest in a tub of warm water. Drink enough fluid to keep your urine pale yellow. Dehydration may cause these contractions. Do slow and deep breathing several times an hour. Keep all follow-up prenatal visits as told by your health care provider. This is important. Contact a health care provider if: You have a fever. You have continuous pain in your abdomen. Get help right away if: Your contractions become stronger, more regular, and closer together. You have fluid leaking or gushing from your vagina. You pass  blood-tinged mucus (bloody show). You have bleeding from your vagina. You have low back pain that you never had before. You feel your baby's head pushing down and causing pelvic pressure. Your baby is not moving inside you as much as it used to. Summary Contractions that occur before labor are called Braxton Hicks contractions, false labor, or practice contractions. Braxton Hicks contractions are usually shorter, weaker, farther apart, and less regular than true labor contractions. True labor contractions usually become progressively stronger and regular, and they become more frequent. Manage discomfort from [facility] contractions by changing position, resting in a warm bath, drinking plenty of water, or practicing deep breathing. This information is not intended to replace advice given to you by your health care provider. Make sure you discuss any questions you have with your health care provider. Document Revised: 04/08/2017 Document Reviewed: 09/09/2016 Elsevier Patient Education  2020 [employer].

## 2023-05-13 ENCOUNTER — Encounter: Payer: Self-pay | Admitting: Women's Health

## 2023-05-19 ENCOUNTER — Inpatient Hospital Stay (HOSPITAL_COMMUNITY)
Admission: RE | Admit: 2023-05-19 | Discharge: 2023-05-22 | DRG: 806 | Disposition: A | Discharge status: Home / Self Care | Payer: Medicaid Other | Attending: Obstetrics and Gynecology | Admitting: Obstetrics and Gynecology

## 2023-05-19 ENCOUNTER — Encounter (HOSPITAL_COMMUNITY): Payer: Self-pay | Admitting: Obstetrics & Gynecology

## 2023-05-19 ENCOUNTER — Encounter: Payer: Medicaid Other | Admitting: Advanced Practice Midwife

## 2023-05-19 ENCOUNTER — Inpatient Hospital Stay (HOSPITAL_COMMUNITY): Payer: Medicaid Other | Admitting: Anesthesiology

## 2023-05-19 ENCOUNTER — Inpatient Hospital Stay (HOSPITAL_COMMUNITY): Payer: Medicaid Other

## 2023-05-19 DIAGNOSIS — Z886 Allergy status to analgesic agent status: Secondary | ICD-10-CM | POA: Diagnosis not present

## 2023-05-19 DIAGNOSIS — O0932 Supervision of pregnancy with insufficient antenatal care, second trimester: Secondary | ICD-10-CM

## 2023-05-19 DIAGNOSIS — O2243 Hemorrhoids in pregnancy, third trimester: Secondary | ICD-10-CM | POA: Diagnosis present

## 2023-05-19 DIAGNOSIS — Z3483 Encounter for supervision of other normal pregnancy, third trimester: Secondary | ICD-10-CM

## 2023-05-19 DIAGNOSIS — D649 Anemia, unspecified: Secondary | ICD-10-CM | POA: Insufficient documentation

## 2023-05-19 DIAGNOSIS — O3663X0 Maternal care for excessive fetal growth, third trimester, not applicable or unspecified: Secondary | ICD-10-CM | POA: Diagnosis not present

## 2023-05-19 DIAGNOSIS — Z3A39 39 weeks gestation of pregnancy: Secondary | ICD-10-CM

## 2023-05-19 DIAGNOSIS — O9902 Anemia complicating childbirth: Secondary | ICD-10-CM | POA: Diagnosis not present

## 2023-05-19 DIAGNOSIS — Z30017 Encounter for initial prescription of implantable subdermal contraceptive: Secondary | ICD-10-CM

## 2023-05-19 DIAGNOSIS — O3660X0 Maternal care for excessive fetal growth, unspecified trimester, not applicable or unspecified: Principal | ICD-10-CM | POA: Diagnosis present

## 2023-05-19 LAB — CBC
HCT: 34.1 % — ABNORMAL LOW (ref 36.0–46.0)
Hemoglobin: 10.5 g/dL — ABNORMAL LOW (ref 12.0–15.0)
MCH: 22.3 pg — ABNORMAL LOW (ref 26.0–34.0)
MCHC: 30.8 g/dL (ref 30.0–36.0)
MCV: 72.6 fL — ABNORMAL LOW (ref 80.0–100.0)
Platelets: 280 10*3/uL (ref 150–400)
RBC: 4.7 MIL/uL (ref 3.87–5.11)
RDW: 15.9 % — ABNORMAL HIGH (ref 11.5–15.5)
WBC: 7.9 10*3/uL (ref 4.0–10.5)
nRBC: 0 % (ref 0.0–0.2)

## 2023-05-19 LAB — TYPE AND SCREEN
ABO/RH(D): O POS
Antibody Screen: NEGATIVE

## 2023-05-19 LAB — RPR: RPR Ser Ql: NONREACTIVE

## 2023-05-19 MED ORDER — HYDROXYZINE HCL 50 MG PO TABS
50.0000 mg | ORAL_TABLET | Freq: Four times a day (QID) | ORAL | Status: DC | PRN
  Administered 2023-05-19 (×2): 50 mg via ORAL
  Filled 2023-05-19 (×2): qty 1

## 2023-05-19 MED ORDER — LACTATED RINGERS IV SOLN: INTRAVENOUS | Status: DC

## 2023-05-19 MED ORDER — PHENYLEPHRINE 80 MCG/ML (10ML) SYRINGE FOR IV PUSH (FOR BLOOD PRESSURE SUPPORT)
80.0000 ug | PREFILLED_SYRINGE | INTRAVENOUS | Status: DC | PRN
Start: 2023-05-19 — End: 2023-05-20

## 2023-05-19 MED ORDER — LIDOCAINE HCL (PF) 1 % IJ SOLN: 30.0000 mL | INTRAMUSCULAR | Status: DC | PRN

## 2023-05-19 MED ORDER — ACETAMINOPHEN 325 MG PO TABS: 650.0000 mg | ORAL_TABLET | ORAL | Status: DC | PRN

## 2023-05-19 MED ORDER — FENTANYL CITRATE (PF) 100 MCG/2ML IJ SOLN
100.0000 ug | INTRAMUSCULAR | Status: DC | PRN
  Administered 2023-05-19 (×2): 100 ug via INTRAVENOUS
  Filled 2023-05-19 (×2): qty 2

## 2023-05-19 MED ORDER — ONDANSETRON HCL 4 MG/2ML IJ SOLN
4.0000 mg | Freq: Four times a day (QID) | INTRAMUSCULAR | Status: DC | PRN
  Administered 2023-05-20: 4 mg via INTRAVENOUS
  Filled 2023-05-19: qty 2

## 2023-05-19 MED ORDER — DIPHENHYDRAMINE HCL 50 MG/ML IJ SOLN
12.5000 mg | INTRAMUSCULAR | Status: DC | PRN
  Administered 2023-05-19: 12.5 mg via INTRAVENOUS
  Filled 2023-05-19: qty 1

## 2023-05-19 MED ORDER — LACTATED RINGERS IV SOLN: 500.0000 mL | Freq: Once | INTRAVENOUS | Status: DC

## 2023-05-19 MED ORDER — OXYTOCIN-SODIUM CHLORIDE 30-0.9 UT/500ML-% IV SOLN
1.0000 m[IU]/min | INTRAVENOUS | Status: DC
  Administered 2023-05-19: 2 m[IU]/min via INTRAVENOUS
  Filled 2023-05-19 (×2): qty 500

## 2023-05-19 MED ORDER — OXYTOCIN BOLUS FROM INFUSION: 333.0000 mL | Freq: Once | INTRAVENOUS | Status: DC

## 2023-05-19 MED ORDER — FLEET ENEMA RE ENEM: 1.0000 | ENEMA | RECTAL | Status: DC | PRN

## 2023-05-19 MED ORDER — OXYCODONE-ACETAMINOPHEN 5-325 MG PO TABS: 2.0000 | ORAL_TABLET | ORAL | Status: DC | PRN

## 2023-05-19 MED ORDER — SOD CITRATE-CITRIC ACID 500-334 MG/5ML PO SOLN: 30.0000 mL | ORAL | Status: DC | PRN

## 2023-05-19 MED ORDER — EPHEDRINE 5 MG/ML INJ: 10.0000 mg | INTRAVENOUS | Status: DC | PRN

## 2023-05-19 MED ORDER — OXYCODONE-ACETAMINOPHEN 5-325 MG PO TABS: 1.0000 | ORAL_TABLET | ORAL | Status: DC | PRN

## 2023-05-19 MED ORDER — BUPIVACAINE HCL (PF) 0.25 % IJ SOLN
INTRAMUSCULAR | Status: DC | PRN
  Administered 2023-05-19: 3 mL via EPIDURAL
  Administered 2023-05-19: 2 mL via EPIDURAL
  Administered 2023-05-19: 5 mL via EPIDURAL

## 2023-05-19 MED ORDER — LACTATED RINGERS IV SOLN
500.0000 mL | INTRAVENOUS | Status: DC | PRN
  Administered 2023-05-19: 500 mL via INTRAVENOUS

## 2023-05-19 MED ORDER — OXYTOCIN-SODIUM CHLORIDE 30-0.9 UT/500ML-% IV SOLN: 2.5000 [IU]/h | INTRAVENOUS | Status: DC

## 2023-05-19 MED ORDER — FENTANYL-BUPIVACAINE-NACL 0.5-0.125-0.9 MG/250ML-% EP SOLN
12.0000 mL/h | EPIDURAL | Status: DC | PRN
  Administered 2023-05-19 (×2): 12 mL/h via EPIDURAL
  Filled 2023-05-19 (×2): qty 250

## 2023-05-19 MED ORDER — EPHEDRINE 5 MG/ML INJ
10.0000 mg | INTRAVENOUS | Status: DC | PRN
Start: 2023-05-19 — End: 2023-05-20

## 2023-05-19 MED ORDER — TERBUTALINE SULFATE 1 MG/ML IJ SOLN: 0.2500 mg | Freq: Once | INTRAMUSCULAR | Status: DC | PRN

## 2023-05-19 NOTE — H&P (Signed)
 Chelsea Armstrong is a 23 y.o. G83P1001 female at [redacted]w[redacted]d by LMP c/w 14w u/s presenting for IOL due to suspected LGA w hx 4th deg lac.   Reports active fetal movement, contractions: irreg, mild; vaginal bleeding: none, membranes: intact.  Initiated prenatal care at [facility] at 16 wks.   Most recent u/s: 36w, EFW 94.7%, AFI 23cm, ant placenta, cephalic.   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)     Past Surgical History: Past Surgical History:  Procedure Laterality Date   HAND SURGERY Bilateral 06/2016    Obstetrical History: OB History     Gravida  2   Para  1   Term  1   Preterm      AB      Living  1      SAB      IAB      Ectopic      Multiple  0   Live Births  1           Social History: Social History   Socioeconomic History   Marital status: Single    Spouse name: Not on file   Number of children: Not on file   Years of education: Not on file   Highest education level: Not on file  Occupational History   Not on file  Tobacco Use   Smoking status: Never   Smokeless tobacco: Never  Vaping Use   Vaping status: Former   Quit date: 11/21/2020   Substances: Nicotine  Substance and Sexual Activity   Alcohol use: No   Drug use: Not Currently    Types: Marijuana   Sexual activity: Yes    Birth control/protection: None  Other Topics Concern   Not on file  Social History Narrative   ** Merged History Encounter **       Social Drivers of Health   Financial Resource Strain:  High Risk (12/25/2020)   Overall Financial Resource Strain (CARDIA)    Difficulty of Paying Living Expenses: Hard  Food Insecurity: No Food Insecurity (05/19/2023)   Hunger Vital Sign    Worried About Running Out of Food in the Last Year: Never true    Ran Out of Food in the Last Year: Never true  Transportation Needs: No Transportation Needs (05/19/2023)   PRAPARE - Administrator, Civil Service (Medical): No    Lack of Transportation (Non-Medical): No  Physical Activity: Insufficiently Active (12/09/2022)   Exercise Vital Sign    Days of Exercise per Week: 2 days    Minutes of Exercise per Session: 10 min  Stress: Stress Concern Present (12/25/2020)   [employer] of Occupational Health - Occupational Stress Questionnaire    Feeling of Stress : To some extent  Social Connections: Socially Isolated (05/19/2023)   Social Connection and Isolation Panel [NHANES]    Frequency of Communication with Friends and Family: Never    Frequency of Social Gatherings with Friends and Family: Never  Attends Religious Services: Never    Active Member of Clubs or Organizations: No    Attends Engineer, Structural: Never    Marital Status: Never married    Family History: Family History  Adopted: Yes  Problem Relation Age of Onset   Bipolar disorder Father    Alcohol abuse Father    Drug abuse Father    Bipolar disorder Mother    Alcohol abuse Mother    Drug abuse Mother     Allergies: Allergies  Allergen Reactions   Ibuprofen      Can't use due to increased liver enzymes    Medications Prior to Admission  Medication Sig Dispense Refill Last Dose/Taking   cyclobenzaprine  (FLEXERIL ) 10 MG tablet Take 1 tablet (10 mg total) by mouth every 8 (eight) hours as needed for muscle spasms. 30 tablet 1 Past Month   cyclobenzaprine  (FLEXERIL ) 5 MG tablet Take 1 tablet (5 mg total) by mouth 3 (three) times daily as needed for muscle spasms. 20 tablet 0 Past Month   Ferrous  Fumarate 150 MG TABS Take 1 tablet (150 mg total) by mouth every other day. 30 tablet 3 Past Month   ondansetron  (ZOFRAN ) 4 MG tablet Take 1 tablet (4 mg total) by mouth every 8 (eight) hours as needed for nausea or vomiting. 20 tablet 6 Past Month   predniSONE  (DELTASONE ) 10 MG tablet Take 4 tablets all at once daily for 10 days 40 tablet 0 Past Month   Prenatal Vit-Fe Fumarate-FA (PRENATAL MULTIVITAMIN) TABS tablet Take 1 tablet by mouth daily at 12 noon.   Past Month    Review of Systems  Pertinent pos/neg as indicated in HPI  Blood pressure 116/72, pulse 88, temperature 97.7 F (36.5 C), temperature source Oral, resp. rate 20, height 5' (1.524 m), weight 92.3 kg, last menstrual period 08/18/2022, not currently breastfeeding. General appearance: alert, cooperative, and no distress Lungs: clear to auscultation bilaterally Heart: regular rate and rhythm Abdomen: gravid, soft, non-tender, EFW by Leopold's approximately 8-9lbs Extremities: 1+ edema  Fetal monitoring: FHR: 140s bpm, variability: moderate,  Accelerations: Present,  decelerations:  Absent Uterine activity: irreg, mild   Presentation: cephalic   Prenatal labs: ABO, Rh: O/Positive/-- (08/01 1146) Antibody: Negative (10/23 0911) Rubella: 3.55 (08/01 1146) RPR: Non Reactive (10/23 0911)  HBsAg: Negative (08/01 1146)  HIV: Non Reactive (10/23 0911)  GBS: Negative/-- (12/18 0329)  2hr GTT: 79/86/113  Prenatal Transfer Tool  Maternal Diabetes: No Genetic Screening: Normal Maternal Ultrasounds/Referrals: Normal Fetal Ultrasounds or other Referrals:  None (suspected LGA) Maternal Substance Abuse:  No Significant Maternal Medications:  Meds include: Other: Prednisone  x 1 Significant Maternal Lab Results: Group B Strep negative  No results found for this or any previous visit (from the past 24 hours).   Assessment:  [redacted]w[redacted]d SIUP  G2P1001  Suspected LGA w hx 4th deg lac  Favorable cervix  Cat 1 FHR  GBS Negative/--  (12/18 0329)  Plan:  Admit to L&D  IV pain meds/epidural prn active labor  Plan for Pit and/or AROM as IOL methods  Anticipate vag delivery   Plans to breastfeed  Contraception: Nexplanon  inpatient  Circumcision: yes  Suzen JONETTA Gentry CNM 05/19/2023, 1:13 AM

## 2023-05-19 NOTE — Progress Notes (Signed)
 23 yo g2p1 @ 39+1 here for iol for  probable lga with a history of 4th degree lac with vacuum-assisted delivery of lga baby last pregnancy, extensively counseled this pregnancy and wants to proceed w/ vaginal delivery, 4.5 cm on arrival now comfortable after epidural on 8 of pitocin , 4.5/80/ballotable on cervical check, cat 1 tracing, arom deferred given risk of cord prolapse, will up-titrate pitocin  and plan for arom when safe to do so.

## 2023-05-19 NOTE — Anesthesia Preprocedure Evaluation (Addendum)
 Anesthesia Evaluation  Patient identified by MRN, date of birth, ID band Patient awake    Reviewed: Allergy & Precautions, NPO status , Patient's Chart, lab work & pertinent test results  Airway Mallampati: II  TM Distance: >3 FB     Dental   Pulmonary neg pulmonary ROS   Pulmonary exam normal        Cardiovascular negative cardio ROS Normal cardiovascular exam     Neuro/Psych negative neurological ROS     GI/Hepatic negative GI ROS,,,Hx of Elevated LFTs. Normal for the past 5 years   Endo/Other  negative endocrine ROS    Renal/GU negative Renal ROS     Musculoskeletal   Abdominal   Peds  Hematology  (+) Blood dyscrasia, anemia   Anesthesia Other Findings   Reproductive/Obstetrics (+) Pregnancy                             Anesthesia Physical Anesthesia Plan  ASA: 2  Anesthesia Plan: Epidural   Post-op Pain Management:    Induction:   PONV Risk Score and Plan: 2 and Treatment may vary due to age or medical condition  Airway Management Planned: Natural Airway  Additional Equipment:   Intra-op Plan:   Post-operative Plan:   Informed Consent: I have reviewed the patients History and Physical, chart, labs and discussed the procedure including the risks, benefits and alternatives for the proposed anesthesia with the patient or authorized representative who has indicated his/her understanding and acceptance.       Plan Discussed with:   Anesthesia Plan Comments:        Anesthesia Quick Evaluation

## 2023-05-19 NOTE — Progress Notes (Addendum)
 L&D Note  05/19/2023 - 5:42 PM  22 y.o. G2P1001 [redacted]w[redacted]d. 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) 06/24/2021   ADHD (attention deficit hyperactivity disorder), combined type 10/06/2011   Oppositional defiant disorder 10/06/2011    Chelsea Armstrong is admitted for IOL for LGA   Subjective:  Comfortable with epidural, not feeling UCs  Objective:     Current Vital Signs 24h Vital Sign Ranges  T 98 F (36.7 C) Temp  Avg: 98.2 F (36.8 C)  Min: 97.7 F (36.5 C)  Max: 98.8 F (37.1 C)  BP 119/77 BP  Min: 89/59  Max: 130/61  HR 83 Pulse  Avg: 87.7  Min: 74  Max: 104  RR 16 Resp  Avg: 17.3  Min: 16  Max: 20  SaO2 99 %   SpO2  Avg: 99.5 %  Min: 99 %  Max: 100 %       24 Hour I/O Current Shift I/O  Time Ins Outs No intake/output data recorded. 01/09 0701 - 01/09 1900 In: -  Out: 1650 [Urine:1650]   FHR: category I with ? Accels, no decels Toco: q16m Gen: NAD SVE: 6.5/80/-1, cephalic>>AROM for clear fluid. +fetal scalp stim; pelvis feels adequate Leopolds: 4100g  Labs:  Recent Labs  Lab 05/19/23 0056  WBC 7.9  HGB 10.5*  HCT 34.1*  PLT 280    Medications Current Facility-Administered Medications  Medication Dose Route Frequency Provider Last Rate Last Admin   acetaminophen  (TYLENOL ) tablet 650 mg  650 mg Oral Q4H PRN Kizzie Suzen SAUNDERS, CNM       diphenhydrAMINE  (BENADRYL ) injection 12.5 mg  12.5 mg Intravenous Q15 min PRN Peggye Delon Brunswick, MD   12.5 mg at 05/19/23 1624   ePHEDrine  injection  10 mg  10 mg Intravenous PRN Peggye Delon Brunswick, MD       ePHEDrine  injection 10 mg  10 mg Intravenous PRN Peggye Delon Brunswick, MD       fentaNYL  (SUBLIMAZE ) injection 100 mcg  100 mcg Intravenous Q1H PRN Kizzie Suzen SAUNDERS, CNM   100 mcg at 05/19/23 9340   fentaNYL  2 mcg/mL w/ bupivacaine  0.125% in NS 250 mL epidural infusion  12 mL/hr Epidural Continuous PRN Peggye Delon Brunswick, MD 12 mL/hr at 05/19/23 0819 12 mL/hr at 05/19/23 0819   hydrOXYzine  (ATARAX ) tablet 50 mg  50 mg Oral Q6H PRN Kizzie Suzen SAUNDERS, CNM   50 mg at 05/19/23 1145   lactated ringers  infusion 500 mL  500 mL Intravenous Once Peggye Delon Brunswick, MD       lactated ringers  infusion 500 mL  500 mL Intravenous Once Epifanio Charleston, MD 999 mL/hr at 05/19/23 0740 Rate Change at 05/19/23 0740   lactated ringers  infusion 500-1,000 mL  500-1,000 mL Intravenous PRN Kizzie Suzen SAUNDERS, CNM       lactated ringers  infusion   Intravenous Continuous Kizzie Suzen SAUNDERS, CNM 125 mL/hr at 05/19/23 0055 New Bag at 05/19/23 0055   lidocaine  (PF) (XYLOCAINE ) 1 % injection 30 mL  30 mL Subcutaneous PRN Kizzie Suzen SAUNDERS, CNM  ondansetron  (ZOFRAN ) injection 4 mg  4 mg Intravenous Q6H PRN Kizzie Suzen SAUNDERS, CNM       oxyCODONE -acetaminophen  (PERCOCET/ROXICET) 5-325 MG per tablet 1 tablet  1 tablet Oral Q4H PRN Kizzie Suzen SAUNDERS, CNM       oxyCODONE -acetaminophen  (PERCOCET/ROXICET) 5-325 MG per tablet 2 tablet  2 tablet Oral Q4H PRN Kizzie Suzen SAUNDERS, CNM       oxytocin  (PITOCIN ) IV BOLUS FROM BAG  333 mL Intravenous Once Booker, Kimberly R, CNM       oxytocin  (PITOCIN ) IV infusion 30 units in NS 500 mL - Premix  1-40 milli-units/min Intravenous Titrated Kizzie Suzen SAUNDERS, CNM 20 mL/hr at 05/19/23 1628 20 milli-units/min at 05/19/23 1628   oxytocin  (PITOCIN ) IV infusion 30 units in NS 500 mL - Premix  2.5 Units/hr Intravenous Continuous Kizzie Suzen SAUNDERS, CNM       PHENYLephrine  80 mcg/ml in normal saline Adult IV  Push Syringe (For Blood Pressure Support)  80 mcg Intravenous PRN Peggye Delon Brunswick, MD       PHENYLephrine  80 mcg/ml in normal saline Adult IV Push Syringe (For Blood Pressure Support)  80 mcg Intravenous PRN Peggye Delon Brunswick, MD       sodium citrate-citric acid  (ORACIT) solution 30 mL  30 mL Oral Q2H PRN Kizzie Suzen SAUNDERS, CNM       sodium phosphate (FLEET) enema 1 enema  1 enema Rectal PRN Kizzie Suzen SAUNDERS, CNM       terbutaline  (BRETHINE ) injection 0.25 mg  0.25 mg Subcutaneous Once PRN Kizzie Suzen SAUNDERS, CNM       Facility-Administered Medications Ordered in Other Encounters  Medication Dose Route Frequency Provider Last Rate Last Admin   bupivacaine  (PF) (MARCAINE ) 0.25 % injection   Epidural Anesthesia Intra-op Epifanio Charleston, MD   3 mL at 05/19/23 9180    Assessment & Plan:  Patient doing well *Pregnancy: category I tracing.  *Labor: will cut pit in half since at 20, AROM and a multip. Anticipate VD *GBS: neg *Analgesia: epidural working well  [name]. MD Attending Center for [employer] [facility])

## 2023-05-19 NOTE — Anesthesia Procedure Notes (Signed)
 Epidural Patient location during procedure: OB Start time: 05/19/2023 8:03 AM End time: 05/19/2023 8:10 AM  Staffing Anesthesiologist: Epifanio Charleston, MD Performed: anesthesiologist   Preanesthetic Checklist Completed: patient identified, IV checked, site marked, risks and benefits discussed, surgical consent, monitors and equipment checked, pre-op evaluation and timeout performed  Epidural Patient position: sitting Prep: DuraPrep and site prepped and draped Patient monitoring: continuous pulse ox and blood pressure Approach: midline Location: L3-L4 Injection technique: LOR air  Needle:  Needle type: Tuohy  Needle gauge: 17 G Needle length: 9 cm and 9 Needle insertion depth: 5 cm cm Catheter type: closed end flexible Catheter size: 19 Gauge Catheter at skin depth: 10 cm Test dose: negative  Assessment Events: blood not aspirated, no cerebrospinal fluid, injection not painful, no injection resistance, no paresthesia and negative IV test

## 2023-05-19 NOTE — Progress Notes (Signed)
 Patient ID: Chelsea Armstrong, female   DOB: 08/22/2000, 23 y.o.   MRN: [id number]   Rec'd Fentanyl  x 2 (latest 0700) and having more cramping now  BPs 108/66, 130/61 FHR 145-150, +accels, occ mi variables Ctx q 3-5 mins with Pit @ 41mu/min Cx deferred   IUP@39 .1wks Suspected LGA Favorable cx  Discussion re AROM> she prefers to have her epidural placed first, so will plan that for afterwards Anticipate vag delivery  Suzen JONETTA Gentry CNM 05/19/2023 7:40 AM

## 2023-05-19 NOTE — Progress Notes (Addendum)
 L&D Note  05/19/2023 - 8:45 PM  23 y.o. G2P1001 [redacted]w[redacted]d. 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) 06/24/2021   ADHD (attention deficit hyperactivity disorder), combined type 10/06/2011   Oppositional defiant disorder 10/06/2011    Ms. Shasta CHRISTELLA Budge is admitted for IOL for LGA   Subjective:  Comfortable with epidural,feeling more pressure  Objective:     Current Vital Signs 24h Vital Sign Ranges  T 97.8 F (36.6 C) Temp  Avg: 98.2 F (36.8 C)  Min: 97.7 F (36.5 C)  Max: 98.8 F (37.1 C)  BP 110/77 BP  Min: 89/59  Max: 130/61  HR 93 Pulse  Avg: 87.1  Min: 73  Max: 104  RR 18 Resp  Avg: 17.1  Min: 16  Max: 20  SaO2 99 % Room Air SpO2  Avg: 99.3 %  Min: 99 %  Max: 100 %       24 Hour I/O Current Shift I/O  Time Ins Outs No intake/output data recorded. No intake/output data recorded.   FHR: 135 baseline, no accels, rare slight variables, and ?early decels, min to mod variabilty Toco: q2-22m Gen: NAD SVE: 8/80/0 to +1,+scalp stim  Labs:  Recent Labs  Lab 05/19/23 0056  WBC 7.9  HGB 10.5*  HCT 34.1*  PLT 280    Medications Current Facility-Administered Medications  Medication Dose Route Frequency Provider Last Rate Last Admin   acetaminophen  (TYLENOL ) tablet 650 mg  650 mg Oral Q4H PRN Kizzie Suzen SAUNDERS, CNM       diphenhydrAMINE  (BENADRYL ) injection 12.5 mg  12.5 mg Intravenous Q15 min PRN Peggye Delon Brunswick, MD   12.5 mg at 05/19/23 1624   ePHEDrine  injection 10 mg  10 mg Intravenous PRN  Peggye Delon Brunswick, MD       ePHEDrine  injection 10 mg  10 mg Intravenous PRN Peggye Delon Brunswick, MD       fentaNYL  (SUBLIMAZE ) injection 100 mcg  100 mcg Intravenous Q1H PRN Kizzie Suzen SAUNDERS, CNM   100 mcg at 05/19/23 0659   fentaNYL  2 mcg/mL w/ bupivacaine  0.125% in NS 250 mL epidural infusion  12 mL/hr Epidural Continuous PRN Peggye Delon Brunswick, MD 12 mL/hr at 05/19/23 0819 12 mL/hr at 05/19/23 0819   hydrOXYzine  (ATARAX ) tablet 50 mg  50 mg Oral Q6H PRN Kizzie Suzen SAUNDERS, CNM   50 mg at 05/19/23 1145   lactated ringers  infusion 500 mL  500 mL Intravenous Once Peggye Delon Brunswick, MD       lactated ringers  infusion 500 mL  500 mL Intravenous Once Epifanio Charleston, MD 999 mL/hr at 05/19/23 0740 Rate Change at 05/19/23 0740   lactated ringers  infusion 500-1,000 mL  500-1,000 mL Intravenous PRN Kizzie Suzen SAUNDERS, CNM 999 mL/hr at 05/19/23 1937 500 mL at 05/19/23 1937   lactated ringers  infusion   Intravenous Continuous Kizzie Suzen SAUNDERS, CNM 125 mL/hr at 05/19/23 0055 New Bag at 05/19/23 0055   lidocaine  (PF) (XYLOCAINE ) 1 % injection 30 mL  30 mL Subcutaneous PRN Kizzie Suzen SAUNDERS, CNM  ondansetron  (ZOFRAN ) injection 4 mg  4 mg Intravenous Q6H PRN Kizzie Suzen SAUNDERS, CNM       oxyCODONE -acetaminophen  (PERCOCET/ROXICET) 5-325 MG per tablet 1 tablet  1 tablet Oral Q4H PRN Kizzie Suzen SAUNDERS, CNM       oxyCODONE -acetaminophen  (PERCOCET/ROXICET) 5-325 MG per tablet 2 tablet  2 tablet Oral Q4H PRN Kizzie Suzen SAUNDERS, CNM       oxytocin  (PITOCIN ) IV BOLUS FROM BAG  333 mL Intravenous Once Booker, Kimberly R, CNM       oxytocin  (PITOCIN ) IV infusion 30 units in NS 500 mL - Premix  1-40 milli-units/min Intravenous Titrated Kizzie Suzen SAUNDERS, CNM 10 mL/hr at 05/19/23 1800 10 milli-units/min at 05/19/23 1800   oxytocin  (PITOCIN ) IV infusion 30 units in NS 500 mL - Premix  2.5 Units/hr Intravenous Continuous Kizzie Suzen SAUNDERS, CNM       PHENYLephrine  80 mcg/ml in  normal saline Adult IV Push Syringe (For Blood Pressure Support)  80 mcg Intravenous PRN Peggye Delon Brunswick, MD       PHENYLephrine  80 mcg/ml in normal saline Adult IV Push Syringe (For Blood Pressure Support)  80 mcg Intravenous PRN Peggye Delon Brunswick, MD       sodium citrate-citric acid  (ORACIT) solution 30 mL  30 mL Oral Q2H PRN Kizzie Suzen SAUNDERS, CNM       sodium phosphate (FLEET) enema 1 enema  1 enema Rectal PRN Kizzie Suzen SAUNDERS, CNM       terbutaline  (BRETHINE ) injection 0.25 mg  0.25 mg Subcutaneous Once PRN Kizzie Suzen SAUNDERS, CNM       Facility-Administered Medications Ordered in Other Encounters  Medication Dose Route Frequency Provider Last Rate Last Admin   bupivacaine  (PF) (MARCAINE ) 0.25 % injection   Epidural Anesthesia Intra-op Fitzgerald, Robert, MD   3 mL at 05/19/23 0819    Assessment & Plan:  Patient doing well *Pregnancy: category II tracing but overall reassuring *Labor: continue with pit at 10. Progressing well.  *GBS: neg *Analgesia: epidural working well  [name]. MD Attending Center for [employer] [facility])

## 2023-05-20 ENCOUNTER — Encounter (HOSPITAL_COMMUNITY): Payer: Self-pay | Admitting: Obstetrics & Gynecology

## 2023-05-20 DIAGNOSIS — O3663X0 Maternal care for excessive fetal growth, third trimester, not applicable or unspecified: Secondary | ICD-10-CM

## 2023-05-20 DIAGNOSIS — Z3A39 39 weeks gestation of pregnancy: Secondary | ICD-10-CM

## 2023-05-20 MED ORDER — TRANEXAMIC ACID-NACL 1000-0.7 MG/100ML-% IV SOLN
INTRAVENOUS | Status: AC
  Filled 2023-05-20: qty 100

## 2023-05-20 MED ORDER — WITCH HAZEL-GLYCERIN EX PADS: 1.0000 | MEDICATED_PAD | CUTANEOUS | Status: DC | PRN

## 2023-05-20 MED ORDER — PRENATAL MULTIVITAMIN CH
1.0000 | ORAL_TABLET | Freq: Every day | ORAL | Status: DC
  Administered 2023-05-20 – 2023-05-22 (×3): 1 via ORAL
  Filled 2023-05-20 (×3): qty 1

## 2023-05-20 MED ORDER — ONDANSETRON HCL 4 MG PO TABS: 4.0000 mg | ORAL_TABLET | ORAL | Status: DC | PRN

## 2023-05-20 MED ORDER — SENNOSIDES-DOCUSATE SODIUM 8.6-50 MG PO TABS: 2.0000 | ORAL_TABLET | Freq: Every evening | ORAL | Status: DC | PRN

## 2023-05-20 MED ORDER — ONDANSETRON HCL 4 MG/2ML IJ SOLN: 4.0000 mg | INTRAMUSCULAR | Status: DC | PRN

## 2023-05-20 MED ORDER — OXYTOCIN-SODIUM CHLORIDE 30-0.9 UT/500ML-% IV SOLN
2.5000 [IU]/h | INTRAVENOUS | Status: DC | PRN
Start: 2023-05-20 — End: 2023-05-22

## 2023-05-20 MED ORDER — TETANUS-DIPHTH-ACELL PERTUSSIS 5-2.5-18.5 LF-MCG/0.5 IM SUSY: 0.5000 mL | PREFILLED_SYRINGE | Freq: Once | INTRAMUSCULAR | Status: DC

## 2023-05-20 MED ORDER — IBUPROFEN 600 MG PO TABS
600.0000 mg | ORAL_TABLET | Freq: Four times a day (QID) | ORAL | Status: DC
  Administered 2023-05-20 – 2023-05-22 (×10): 600 mg via ORAL
  Filled 2023-05-20 (×10): qty 1

## 2023-05-20 MED ORDER — OXYCODONE HCL 5 MG PO TABS
5.0000 mg | ORAL_TABLET | Freq: Four times a day (QID) | ORAL | Status: DC | PRN
  Administered 2023-05-20: 5 mg via ORAL
  Filled 2023-05-20: qty 1

## 2023-05-20 MED ORDER — BENZOCAINE-MENTHOL 20-0.5 % EX AERO
1.0000 | INHALATION_SPRAY | CUTANEOUS | Status: DC | PRN
  Administered 2023-05-20: 1 via TOPICAL
  Filled 2023-05-20: qty 56

## 2023-05-20 MED ORDER — TRANEXAMIC ACID-NACL 1000-0.7 MG/100ML-% IV SOLN
1000.0000 mg | INTRAVENOUS | Status: AC
  Administered 2023-05-20: 1000 mg via INTRAVENOUS

## 2023-05-20 MED ORDER — ACETAMINOPHEN 325 MG PO TABS
650.0000 mg | ORAL_TABLET | ORAL | Status: DC | PRN
  Administered 2023-05-20 – 2023-05-22 (×8): 650 mg via ORAL
  Filled 2023-05-20 (×8): qty 2

## 2023-05-20 MED ORDER — DIPHENHYDRAMINE HCL 25 MG PO CAPS: 25.0000 mg | ORAL_CAPSULE | Freq: Four times a day (QID) | ORAL | Status: DC | PRN

## 2023-05-20 MED ORDER — BISACODYL 5 MG PO TBEC
5.0000 mg | DELAYED_RELEASE_TABLET | Freq: Once | ORAL | Status: DC
  Filled 2023-05-20: qty 1

## 2023-05-20 MED ORDER — POLYETHYLENE GLYCOL 3350 17 G PO PACK
17.0000 g | PACK | Freq: Every day | ORAL | Status: DC
  Administered 2023-05-20 – 2023-05-22 (×2): 17 g via ORAL
  Filled 2023-05-20 (×3): qty 1

## 2023-05-20 MED ORDER — CEFAZOLIN SODIUM-DEXTROSE 2-4 GM/100ML-% IV SOLN
2.0000 g | Freq: Once | INTRAVENOUS | Status: AC
  Administered 2023-05-20: 2 g via INTRAVENOUS
  Filled 2023-05-20: qty 100

## 2023-05-20 MED ORDER — COCONUT OIL OIL: 1.0000 | TOPICAL_OIL | Status: DC | PRN

## 2023-05-20 MED ORDER — SIMETHICONE 80 MG PO CHEW: 80.0000 mg | CHEWABLE_TABLET | ORAL | Status: DC | PRN

## 2023-05-20 MED ORDER — DIBUCAINE (PERIANAL) 1 % EX OINT: 1.0000 | TOPICAL_OINTMENT | CUTANEOUS | Status: DC | PRN

## 2023-05-20 NOTE — Anesthesia Postprocedure Evaluation (Signed)
 Anesthesia Post Note  Patient: Chelsea Armstrong  Procedure(s) Performed: AN AD HOC LABOR EPIDURAL     Patient location during evaluation: Mother Baby Anesthesia Type: Epidural Level of consciousness: awake, awake and alert and oriented Pain management: pain level controlled Vital Signs Assessment: post-procedure vital signs reviewed and stable Respiratory status: spontaneous breathing, nonlabored ventilation and respiratory function stable Cardiovascular status: blood pressure returned to baseline and stable Postop Assessment: no headache, no backache, no apparent nausea or vomiting, able to ambulate, adequate PO intake and patient able to bend at knees Anesthetic complications: no   No notable events documented.  Last Vitals:  Vitals:   05/20/23 1537 05/20/23 2011  BP: 126/70 119/70  Pulse: 81 83  Resp: 18 18  Temp: 36.7 C 36.6 C  SpO2: 100% 100%    Last Pain:  Vitals:   05/20/23 2011  TempSrc: Oral  PainSc:    Pain Goal:                   Tosha Belgarde

## 2023-05-20 NOTE — Progress Notes (Signed)
 Pt declines baby script app and stated she still has education book from first child.

## 2023-05-20 NOTE — Progress Notes (Signed)
 L&D Note  05/20/2023 - 1:14 AM  22 y.o. G2P1001 [redacted]w[redacted]d. 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) 06/24/2021   ADHD (attention deficit hyperactivity disorder), combined type 10/06/2011   Oppositional defiant disorder 10/06/2011    Chelsea Armstrong is admitted for IOL for LGA   Subjective:  More comfortable after hitting epidural button at around 1215am  Objective:     Current Vital Signs 24h Vital Sign Ranges  T 98 F (36.7 C) Temp  Avg: 98.2 F (36.8 C)  Min: 97.8 F (36.6 C)  Max: 98.8 F (37.1 C)  BP (!) 109/57 BP  Min: 89/59  Max: 130/61  HR 76 Pulse  Avg: 87.3  Min: 73  Max: 104  RR 18 Resp  Avg: 16.8  Min: 16  Max: 20  SaO2 99 % Room Air SpO2  Avg: 99.3 %  Min: 99 %  Max: 100 %       24 Hour I/O Current Shift I/O  Time Ins Outs 01/09 0701 - 01/10 0700 In: -  Out: 2300 [Urine:2300] No intake/output data recorded.   FHR: 145 baseline, no accels, rare slight variables, had some subtle lates, min to mod variability Toco: q2-36m Gen: NAD SVE: complete/+1 to +2 lead edge  Labs:  Recent Labs  Lab 05/19/23 0056  WBC 7.9  HGB 10.5*  HCT 34.1*  PLT 280    Medications Current Facility-Administered Medications  Medication Dose Route Frequency Provider Last Rate Last Admin   acetaminophen  (TYLENOL ) tablet 650 mg  650 mg Oral Q4H PRN Kizzie Suzen SAUNDERS, CNM       diphenhydrAMINE  (BENADRYL ) injection 12.5 mg  12.5 mg Intravenous Q15 min PRN Peggye Delon Brunswick, MD   12.5 mg at 05/19/23 1624    ePHEDrine  injection 10 mg  10 mg Intravenous PRN Peggye Delon Brunswick, MD       ePHEDrine  injection 10 mg  10 mg Intravenous PRN Peggye Delon Brunswick, MD       fentaNYL  (SUBLIMAZE ) injection 100 mcg  100 mcg Intravenous Q1H PRN Kizzie Suzen SAUNDERS, CNM   100 mcg at 05/19/23 9340   fentaNYL  2 mcg/mL w/ bupivacaine  0.125% in NS 250 mL epidural infusion  12 mL/hr Epidural Continuous PRN Peggye Delon Brunswick, MD 12 mL/hr at 05/19/23 2200 12 mL/hr at 05/19/23 2200   hydrOXYzine  (ATARAX ) tablet 50 mg  50 mg Oral Q6H PRN Kizzie Suzen SAUNDERS, CNM   50 mg at 05/19/23 1145   lactated ringers  infusion 500 mL  500 mL Intravenous Once Peggye Delon Brunswick, MD       lactated ringers  infusion 500 mL  500 mL Intravenous Once Epifanio Charleston, MD 999 mL/hr at 05/19/23 0740 Rate Change at 05/19/23 0740   lactated ringers  infusion   Intravenous Continuous Kizzie Suzen SAUNDERS, CNM 125 mL/hr at 05/19/23 0055 New Bag at 05/19/23 0055   lidocaine  (PF) (XYLOCAINE ) 1 % injection 30 mL  30 mL Subcutaneous PRN Kizzie Suzen SAUNDERS, CNM       ondansetron  (ZOFRAN ) injection 4 mg  4 mg Intravenous Q6H PRN Kizzie,  Suzen SAUNDERS, CNM       oxyCODONE -acetaminophen  (PERCOCET/ROXICET) 5-325 MG per tablet 1 tablet  1 tablet Oral Q4H PRN Kizzie Suzen SAUNDERS, CNM       oxyCODONE -acetaminophen  (PERCOCET/ROXICET) 5-325 MG per tablet 2 tablet  2 tablet Oral Q4H PRN Kizzie Suzen SAUNDERS, CNM       oxytocin  (PITOCIN ) IV BOLUS FROM BAG  333 mL Intravenous Once Booker, Kimberly R, CNM       oxytocin  (PITOCIN ) IV infusion 30 units in NS 500 mL - Premix  1-40 milli-units/min Intravenous Titrated Kizzie Suzen SAUNDERS, CNM 12 mL/hr at 05/19/23 2205 12 milli-units/min at 05/19/23 2205   oxytocin  (PITOCIN ) IV infusion 30 units in NS 500 mL - Premix  2.5 Units/hr Intravenous Continuous Kizzie Suzen SAUNDERS, CNM       PHENYLephrine  80 mcg/ml in normal saline Adult IV Push Syringe (For Blood Pressure Support)  80 mcg Intravenous PRN Peggye Delon Brunswick, MD       PHENYLephrine  80 mcg/ml in normal saline Adult IV Push Syringe (For Blood Pressure Support)  80 mcg Intravenous PRN Peggye Delon Brunswick, MD       sodium citrate-citric acid  (ORACIT) solution 30 mL  30 mL Oral Q2H PRN Kizzie Suzen SAUNDERS, CNM       sodium phosphate (FLEET) enema 1 enema  1 enema Rectal PRN Kizzie Suzen SAUNDERS, CNM       terbutaline  (BRETHINE ) injection 0.25 mg  0.25 mg Subcutaneous Once PRN Kizzie Suzen SAUNDERS, CNM       Facility-Administered Medications Ordered in Other Encounters  Medication Dose Route Frequency Provider Last Rate Last Admin   bupivacaine  (PF) (MARCAINE ) 0.25 % injection   Epidural Anesthesia Intra-op Epifanio Charleston, MD   3 mL at 05/19/23 0819    Assessment & Plan:  Patient doing well *Pregnancy: category II tracing but overall reassuring *Labor: continue with pit at 12 and will increase to 14. Told her recommend holding off on pushing epidural button as she had a few contractions when I was in there and didn't feel them at all. Labor down one hour and reassess. Progressing well.  *GBS: neg *Analgesia: epidural working well  Chelsea Armstrong. MD Attending Center for [employer] [facility])

## 2023-05-20 NOTE — Lactation Note (Signed)
 This note was copied from a baby's chart. Lactation Consultation Note  Patient Name: Chelsea Armstrong Date: 05/20/2023 Age:21 hours Reason for consult: Initial assessment;1st time breastfeeding (LGA)  P2, Mother states she would like to breastfeed and formula feed.  Attempted latching but during mother became hot and uncomfortable so feeding attempt was stopped. Reviewed volume guidelines, provided milk storage. Suggest calling for help as needed. Feed on demand with cues.  Goal 8-12+ times per day after first 24 hrs.  Place baby STS if not cueing.    Maternal Data Has patient been taught Hand Expression?: No Does the patient have breastfeeding experience prior to this delivery?: No  Feeding Mother's Current Feeding Choice: Breast Milk and Formula Nipple Type: Slow - flow  LATCH Score Latch: Repeated attempts needed to sustain latch, nipple held in mouth throughout feeding, stimulation needed to elicit sucking reflex.  Audible Swallowing: None  Type of Nipple: Everted at rest and after stimulation  Comfort (Breast/Nipple): Soft / non-tender  Hold (Positioning): Assistance needed to correctly position infant at breast and maintain latch.  LATCH Score: 6  Interventions Interventions: Breast feeding basics reviewed;Assisted with latch;Skin to skin;Education;LC Services brochure  Discharge Pump:  (Does not have a home pump)  Consult Status Consult Status: Follow-up Date: 05/20/23 Follow-up type: In-patient    Shannon Dines [facility] 05/20/2023, 8:52 AM

## 2023-05-21 ENCOUNTER — Encounter (HOSPITAL_COMMUNITY): Payer: Self-pay | Admitting: Obstetrics & Gynecology

## 2023-05-21 DIAGNOSIS — Z30017 Encounter for initial prescription of implantable subdermal contraceptive: Secondary | ICD-10-CM

## 2023-05-21 LAB — CBC
HCT: 25.7 % — ABNORMAL LOW (ref 36.0–46.0)
Hemoglobin: 7.9 g/dL — ABNORMAL LOW (ref 12.0–15.0)
MCH: 22.6 pg — ABNORMAL LOW (ref 26.0–34.0)
MCHC: 30.7 g/dL (ref 30.0–36.0)
MCV: 73.6 fL — ABNORMAL LOW (ref 80.0–100.0)
Platelets: 214 10*3/uL (ref 150–400)
RBC: 3.49 MIL/uL — ABNORMAL LOW (ref 3.87–5.11)
RDW: 16.5 % — ABNORMAL HIGH (ref 11.5–15.5)
WBC: 10.7 10*3/uL — ABNORMAL HIGH (ref 4.0–10.5)
nRBC: 0 % (ref 0.0–0.2)

## 2023-05-21 MED ORDER — LACTATED RINGERS IV BOLUS
1000.0000 mL | Freq: Once | INTRAVENOUS | Status: AC
  Administered 2023-05-21: 1000 mL via INTRAVENOUS

## 2023-05-21 MED ORDER — IRON SUCROSE 500 MG IVPB - SIMPLE MED
500.0000 mg | Freq: Once | INTRAVENOUS | Status: DC
  Filled 2023-05-21: qty 275

## 2023-05-21 MED ORDER — LIDOCAINE HCL 1 % IJ SOLN
0.0000 mL | Freq: Once | INTRAMUSCULAR | Status: AC | PRN
  Administered 2023-05-21: 5 mL via INTRADERMAL
  Filled 2023-05-21: qty 20

## 2023-05-21 MED ORDER — IBUPROFEN 600 MG PO TABS: 600.0000 mg | ORAL_TABLET | Freq: Four times a day (QID) | ORAL | 0 refills | Status: AC | PRN

## 2023-05-21 MED ORDER — SODIUM CHLORIDE 0.9 % IV SOLN
500.0000 mg | Freq: Once | INTRAVENOUS | Status: AC
  Administered 2023-05-21: 500 mg via INTRAVENOUS
  Filled 2023-05-21: qty 25

## 2023-05-21 MED ORDER — SENNOSIDES-DOCUSATE SODIUM 8.6-50 MG PO TABS: 2.0000 | ORAL_TABLET | Freq: Every evening | ORAL | 0 refills | Status: AC | PRN

## 2023-05-21 MED ORDER — ETONOGESTREL 68 MG ~~LOC~~ IMPL
68.0000 mg | DRUG_IMPLANT | Freq: Once | SUBCUTANEOUS | Status: AC
  Administered 2023-05-21: 68 mg via SUBCUTANEOUS
  Filled 2023-05-21: qty 1

## 2023-05-21 NOTE — Procedures (Signed)
 POSTPLACENTAL NEXPLANON  INSERTION Patient name: Chelsea Armstrong MRN [id number]  Date of birth: 2000/10/20  Risks/benefits/side effects of Nexplanon  have been discussed and her questions have been answered.  Specifically, a failure rate of 05/998 has been reported, with an increased failure rate if pt takes St. John's Wort and/or antiseizure medicaitons.  She is aware of the common side effect of irregular bleeding, which the incidence of decreases over time. Signed copy of informed consent in chart.   Time out was performed.  She is right-handed, so her left arm, approximately 8-10 cm (3-4 inches) from the medial epicondyle of the humerus and 3-5 cm (1.25-2 inches) posterior to (below) the sulcus (groove) between the biceps and triceps muscle was cleansed with alcohol and anesthetized with 2cc of 2% Lidocaine .  The area was cleansed again with betadine and the Nexplanon  was inserted per manufacturer's recommendations without difficulty.  3 steri-strips and pressure bandage were applied. The patient tolerated the procedure well. There was minimal blood loss.   Patient was given post procedure instructions and Nexplanon  user card with expiration date. Patient was asked to keep the pressure dressing on for 24 hours to minimize bruising and keep the adhesive bandage on for 3-5 days. The patient verbalized understanding of the plan of care and agrees.   Nexplanon  charges entered.   Suzen JONETTA Gentry CNM 05/21/2023 11:15 AM

## 2023-05-21 NOTE — Progress Notes (Signed)
 CSW received and acknowledges consult for ADHD. Consult screened out due to ADHD does not warrant a CSW consult.    Per chart review, MOB has a history of Oppositional Defiant Disorder. However, patient's most recent diagnosis of ODD was in 2013. Edinburgh score of 2 during current admission.   Please contact the Clinical Social Worker if needs arise, by [facility] request, or if MOB scores greater than 9/yes to question 10 on Edinburgh Postpartum Depression Screen.   Signed,  Sharyne LOIS Roulette, MSW, LCSWA, LCASA 05/21/2023 9:06 AM

## 2023-05-21 NOTE — Progress Notes (Addendum)
 Spoke with nursing, pt having episodes of dizziness though able to get up and ambulate to bathroom with nursing.  Spoke with nursing and given subjective symptoms will get orthostatics and if positive will given fluid bolus while awaiting stat CBC.  QBL minimal and starting Hgb 10.5.    #Dizzy - CBC, less likely to need blood products give starting Hgb and minimal QBL but may benefit from IV Iron   - orthostatics, fluids if positive   UPDATE: 0520 Orthostatic given increase in BMP 20, 1L LR bolus  CBC still pending  Augustin Slade, MD Family Medicine - Obstetrics Fellow

## 2023-05-21 NOTE — Discharge Summary (Addendum)
 Postpartum Discharge Summary     Patient Name: Chelsea Armstrong DOB: 2000-07-20 MRN: [id number]  Date of admission: 05/19/2023 Delivery date:05/20/2023 Delivering provider: IZELL HARARI Date of discharge: 05/21/2023  Admitting diagnosis: LGA (large for gestational age) fetus affecting management of mother [O36.60X0] Intrauterine pregnancy: [redacted]w[redacted]d     Secondary diagnosis:  Principal Problem:   LGA (large for gestational age) fetus affecting management of mother Active Problems:   Vacuum-assisted vaginal delivery   h/o 4th degree   Late prenatal care in second trimester   Encounter for initial prescription of implantable subdermal contraceptive  Additional problems: none    Discharge diagnosis: Term Pregnancy Delivered                                              Post partum procedures: Nexplanon  placement PPD#1 Augmentation: AROM and Pitocin  Complications: None  Hospital course: Induction of Labor With Vaginal Delivery   23 y.o. yo [id number] at [redacted]w[redacted]d was admitted to the hospital 05/19/2023 for induction of labor.  Indication for induction:  suspected LGA .  Patient had an labor course complicated by protracted 2nd stage with pushing >2h, but no difficulty w delivery. Membrane Rupture Time/Date: 5:38 PM,05/19/2023  Delivery Method:Vaginal, Spontaneous Operative Delivery:N/A Episiotomy: None Lacerations:  2nd degree Details of delivery can be found in separate delivery note.  Patient had a postpartum course remarkable for PPD#1 Hgb of 7.9 (10.5 on admit); she was experiencing intermittent dizziness with 77>96 pulse change with orthostatics- given some IV fluid and IV Venofer . She also had a PP Nexplanon  placement prior to d/c. Patient is discharged home 05/21/23 per her request for early d/c as long as the baby can go.  Newborn Data: Birth date:05/20/2023 Birth time:3:50 AM Gender:Female Living status:Living Apgars:8 ,9  Weight:4640 g (10lb 3.7oz)  Magnesium Sulfate received: No BMZ  received: No Rhophylac:N/A MMR:N/A T-DaP:Given prenatally Flu: Yes RSV Vaccine received: No Transfusion:No (IV Venofer )  Immunizations received: Immunization History  Administered Date(s) Administered   Influenza, Seasonal, Injecte, Preservative Fre 04/27/2023   Influenza,inj,Quad PF,6+ Mos 05/12/2021   Tdap 05/12/2021, 04/27/2023    Physical exam  Vitals:   05/21/23 0418 05/21/23 0422 05/21/23 0424 05/21/23 0425  BP: 104/69 112/67 107/71 110/75  Pulse: 85 77 88 96  Resp: 18 18 18 18   Temp: 98 F (36.7 C) 98 F (36.7 C)    TempSrc: Oral     SpO2:      Weight:      Height:       General: alert and cooperative Lochia: appropriate Uterine Fundus: firm Incision: N/A DVT Evaluation: No evidence of DVT seen on physical exam. Labs: Lab Results  Component Value Date   WBC 10.7 (H) 05/21/2023   HGB 7.9 (L) 05/21/2023   HCT 25.7 (L) 05/21/2023   MCV 73.6 (L) 05/21/2023   PLT 214 05/21/2023      Latest Ref Rng & Units 04/12/2023    8:41 PM  CMP  Glucose 70 - 99 mg/dL 895   BUN 6 - 20 mg/dL 5   Creatinine 9.55 - 8.99 mg/dL 9.59   Sodium 864 - 854 mmol/L 136   Potassium 3.5 - 5.1 mmol/L 3.4   Chloride 98 - 111 mmol/L 104   CO2 22 - 32 mmol/L 23   Calcium 8.9 - 10.3 mg/dL 9.3   Total Protein 6.5 - 8.1 g/dL  5.9   Total Bilirubin <1.2 mg/dL 0.4   Alkaline Phos 38 - 126 U/L 100   AST 15 - 41 U/L 15   ALT 0 - 44 U/L 9    Edinburgh Score:    05/20/2023   12:00 PM  Edinburgh Postnatal Depression Scale Screening Tool  I have been able to laugh and see the funny side of things. 0  I have looked forward with enjoyment to things. 0  I have blamed myself unnecessarily when things went wrong. 0  I have been anxious or worried for no good reason. 0  I have felt scared or panicky for no good reason. 1  Things have been getting on top of me. 1  I have been so unhappy that I have had difficulty sleeping. 0  I have felt sad or miserable. 0  I have been so unhappy that I have  been crying. 0  The thought of harming myself has occurred to me. 0  Edinburgh Postnatal Depression Scale Total 2   Edinburgh Postnatal Depression Scale Total: 2   After visit meds:  Allergies as of 05/21/2023       Reactions   No Known Allergies         Medication List     STOP taking these medications    cyclobenzaprine  10 MG tablet Commonly known as: FLEXERIL    cyclobenzaprine  5 MG tablet Commonly known as: FLEXERIL    ondansetron  4 MG tablet Commonly known as: ZOFRAN    predniSONE  10 MG tablet Commonly known as: DELTASONE        TAKE these medications    Ferrous Fumarate  150 MG Tabs Take 1 tablet (150 mg total) by mouth every other day.   ibuprofen  600 MG tablet Commonly known as: ADVIL  Take 1 tablet (600 mg total) by mouth every 6 (six) hours as needed.   prenatal multivitamin Tabs tablet Take 1 tablet by mouth daily at 12 noon.   senna-docusate 8.6-50 MG tablet Commonly known as: Senokot-S Take 2 tablets by mouth at bedtime as needed for mild constipation.          Discharge home in stable condition Infant Feeding: Bottle and Breast Infant Disposition:home with mother Discharge instruction: per After Visit Summary and Postpartum booklet. Activity: Advance as tolerated. Pelvic rest for 6 weeks.  Diet: routine diet Future Appointments: Future Appointments  Date Time Provider Department Center  05/27/2023 10:30 AM Ozan, Jennifer, DO [facility]  06/22/2023  9:50 AM Ozan, Jennifer, DO [facility]   Follow up Visit:  (Visits already scheduled)  05/21/2023 Suzen JONETTA Gentry, CNM 11:31 AM

## 2023-05-21 NOTE — Lactation Note (Signed)
 This note was copied from a baby's chart. Lactation Consultation Note  Patient Name: Chelsea Armstrong Date: 05/21/2023 Age:21 hours Reason for consult: Follow-up assessment;Term;Infant weight loss (infant weight loss -4.20%, LGA greater than 9 lbs at birth. See MOB MR: anemia).  P2, Per MOB, infant emesis is resolving since infant switched to Similac Pro Total Comfort Gentle, infant recently consumed 15 ml within 1 hr pace feed and did not spit up. MOB made two attempts of latching infant today, infant briefly BF less than 1 minute. MOB plans to start back latching infant at the breast, will attempt to latch infant first and afterwards offer infant any EBM first and then formula. Infant had one stool and two voids today since 1 am. LC set infant up with DEBP fitted with 18 mm breast flange, MOB was pumping when LC left the room. MOB knows that her EBM is safe for 4 hours at room temperature whereas formula once open must be discarded within 1 hour.   LC sent referral for STORK DEBP tonight.   Current feeding plan:  1- MOB will attempt to latch infant first every feeding by cues, on demand, every 2-3 hours, skin to skin. MOB knows to call for latch assistance if needed. 2- MOB will continue to supplement infant until infant is latching well and her milk supply is established. MOB given Feeding Guidelines Sheet , if infant latched she plans to offer infant 7-12 mls of EBM/Formula per feeding or more if infant wants it, and if infant does not latch she will offer 15-30 mls per feeding of EBM/Formula. 3- MOB will continue to use DEBP every 3 hours for 15 minutes on initial setting.  Maternal Data Has patient been taught Hand Expression?: Yes  Feeding Mother's Current Feeding Choice: Breast Milk and Formula  LATCH Score  LC did not observe latch due infant consuming 15 mls of formula one hour prior to [facility] entering the room.                  Lactation Tools  Discussed/Used Tools: Pump;Flanges Flange Size: 18 Breast pump type: Double-Electric Breast Pump Pump Education: Milk Storage;Setup, frequency, and cleaning Reason for Pumping: MOB wants to BF, infant not been latching well due alot of emesis since birth, pumping to help establish and stimulate milk supply. Pumping frequency: MOB will continue to use DEBP every 3 hours for 15 minutes.  Interventions    Discharge Pump:  (LC sent referrral for STORK DEBP)  Consult Status Consult Status: Follow-up Date: 05/22/23 Follow-up type: In-patient    Chelsea Armstrong 05/21/2023, 9:15 PM

## 2023-05-22 ENCOUNTER — Ambulatory Visit (HOSPITAL_COMMUNITY): Payer: Self-pay

## 2023-05-22 DIAGNOSIS — D649 Anemia, unspecified: Secondary | ICD-10-CM | POA: Insufficient documentation

## 2023-05-22 NOTE — Lactation Note (Signed)
 This note was copied from a baby's chart. Lactation Consultation Note  Patient Name: Chelsea Armstrong Date: 05/22/2023 Age:21 hours Reason for consult: Follow-up assessment;Infant weight loss;Primapara;1st time breastfeeding;Term (6 % weight loss, per mom baby is for a circ this am. LC checked and not scheduled yet. LC mentioned to feed, but keep the volume 30 ml or less. Stork referral sent again this am.) LC will F/U for D/C teaching,   Maternal Data    Feeding Mother's Current Feeding Choice: Breast Milk and Formula Nipple Type: Dr. Jonna Galli    Lactation Tools Discussed/Used Tools: Pump;Flanges Flange Size: 18 Breast pump type: Double-Electric Breast Pump Pump Education: Setup, frequency, and cleaning;Milk Storage;Other (comment) (set up by previous LC)  Interventions  Education   Discharge Pump: Stork Pump [facility] sent the referral again this am)  Consult Status Consult Status: Follow-up Date: 05/22/23 Follow-up type: In-patient    Rollene Caldron Nichele Slawson 05/22/2023, 8:58 AM

## 2023-05-22 NOTE — Discharge Summary (Signed)
 Postpartum Discharge Summary  Patient Name: Chelsea Armstrong DOB: 2000/12/07 MRN: [id number]  Date of admission: 05/19/2023 Delivery date:05/20/2023 Delivering provider: IZELL HARARI Date of discharge: 05/22/2023  Admitting diagnosis: LGA (large for gestational age) fetus affecting management of mother [O36.60X0] Intrauterine pregnancy: [redacted]w[redacted]d     Secondary diagnosis:  Principal Problem:   LGA (large for gestational age) fetus affecting management of mother Active Problems:   Vacuum-assisted vaginal delivery   h/o 4th degree   Late prenatal care in second trimester   Encounter for initial prescription of implantable subdermal contraceptive   Symptomatic anemia  Additional problems: none    Discharge diagnosis: Term Pregnancy Delivered                                              Post partum procedures: Nexplanon  PPD1 Augmentation: AROM and Pitocin  Complications: None  Hospital course: Induction of Labor With Vaginal Delivery   23 y.o. yo [id number] at [redacted]w[redacted]d was admitted to the hospital 05/19/2023 for induction of labor.  Indication for induction:  LGA .  Patient had an labor course complicated by protracted 2nd stage with pushing >2h.  Membrane Rupture Time/Date: 5:38 PM,05/19/2023  Delivery Method:Vaginal, Spontaneous Operative Delivery:N/A Episiotomy: None Lacerations:  2nd degree Details of delivery can be found in separate delivery note.  Patient had a postpartum course complicated by symptomatic anemia. Post delivery Hgb 7.9. She received IV iron  and 1L bolus with resolution of her symptoms. Nexplanon  placed PPD. Patient is discharged home in good condition 05/22/23.  Newborn Data: Birth date:05/20/2023 Birth time:3:50 AM Gender:Female Living status:Living Apgars:8 ,9  Weight:4640 g  Magnesium Sulfate received: No BMZ received: No Rhophylac:N/A MMR:N/A T-DaP:Given prenatally Flu: Yes RSV Vaccine received: No Transfusion:No  Immunizations received: Immunization  History  Administered Date(s) Administered   Influenza, Seasonal, Injecte, Preservative Fre 04/27/2023   Influenza,inj,Quad PF,6+ Mos 05/12/2021   Tdap 05/12/2021, 04/27/2023    Physical exam  Vitals:   05/21/23 1852 05/21/23 2129 05/21/23 2130 05/21/23 2131  BP: 107/70 107/63 107/69 100/67  Pulse: 76 63 80 95  Resp: 18 18 18 18   Temp: 98.2 F (36.8 C)     TempSrc: Oral     SpO2: 97%     Weight:      Height:       General: alert, cooperative, and no distress Lochia: appropriate Uterine Fundus: firm Incision: N/A DVT Evaluation: No evidence of DVT seen on physical exam. Labs: Lab Results  Component Value Date   WBC 10.7 (H) 05/21/2023   HGB 7.9 (L) 05/21/2023   HCT 25.7 (L) 05/21/2023   MCV 73.6 (L) 05/21/2023   PLT 214 05/21/2023      Latest Ref Rng & Units 04/12/2023    8:41 PM  CMP  Glucose 70 - 99 mg/dL 895   BUN 6 - 20 mg/dL 5   Creatinine 9.55 - 8.99 mg/dL 9.59   Sodium 864 - 854 mmol/L 136   Potassium 3.5 - 5.1 mmol/L 3.4   Chloride 98 - 111 mmol/L 104   CO2 22 - 32 mmol/L 23   Calcium 8.9 - 10.3 mg/dL 9.3   Total Protein 6.5 - 8.1 g/dL 5.9   Total Bilirubin <8.7 mg/dL 0.4   Alkaline Phos 38 - 126 U/L 100   AST 15 - 41 U/L 15   ALT 0 - 44 U/L 9  Edinburgh Score:    05/20/2023   12:00 PM  Edinburgh Postnatal Depression Scale Screening Tool  I have been able to laugh and see the funny side of things. 0  I have looked forward with enjoyment to things. 0  I have blamed myself unnecessarily when things went wrong. 0  I have been anxious or worried for no good reason. 0  I have felt scared or panicky for no good reason. 1  Things have been getting on top of me. 1  I have been so unhappy that I have had difficulty sleeping. 0  I have felt sad or miserable. 0  I have been so unhappy that I have been crying. 0  The thought of harming myself has occurred to me. 0  Edinburgh Postnatal Depression Scale Total 2   No data recorded  After visit meds:   Allergies as of 05/22/2023       Reactions   No Known Allergies         Medication List     STOP taking these medications    cyclobenzaprine  10 MG tablet Commonly known as: FLEXERIL    cyclobenzaprine  5 MG tablet Commonly known as: FLEXERIL    ondansetron  4 MG tablet Commonly known as: ZOFRAN    predniSONE  10 MG tablet Commonly known as: DELTASONE        TAKE these medications    Ferrous Fumarate  150 MG Tabs Take 1 tablet (150 mg total) by mouth every other day.   ibuprofen  600 MG tablet Commonly known as: ADVIL  Take 1 tablet (600 mg total) by mouth every 6 (six) hours as needed.   prenatal multivitamin Tabs tablet Take 1 tablet by mouth daily at 12 noon.   senna-docusate 8.6-50 MG tablet Commonly known as: Senokot-S Take 2 tablets by mouth at bedtime as needed for mild constipation.         Discharge home in stable condition Infant Feeding: Bottle and Breast Infant Disposition:home with mother Discharge instruction: per After Visit Summary and Postpartum booklet. Activity: Advance as tolerated. Pelvic rest for 6 weeks.  Diet: routine diet Future Appointments: Future Appointments  Date Time Provider Department Center  05/27/2023 10:30 AM Ozan, Jennifer, DO [facility]  06/22/2023  9:50 AM Ozan, Jennifer, DO [facility]   Follow up Visit:  Follow-up Information     [facility] Follow up on 05/27/2023.   Specialty: Obstetrics and Gynecology Why: Please keep your scheduled appointment Contact information: [address] [place]  [address] [phone]        [facility] Follow up on 06/22/2023.   Specialty: Obstetrics and Gynecology Why: Please keep your scheduled appointment for routine postpartum care Contact information: [address] [place]  [address] [phone]               Postpartum  visits scheduled as above  05/22/2023 Kieth JAYS[place] Carolin, MD

## 2023-05-22 NOTE — Lactation Note (Signed)
 This note was copied from a baby's chart. Lactation Consultation Note  Patient Name: Chelsea Armstrong Date: 05/22/2023 Age:21 hours Reason for consult: Follow-up assessment;Term;Infant weight loss (LC informed mom her Tere SCHAUMANN was not delivered today and tomorrow am will be sent again prior to D/C. Per mom pumped off some colostrum and fed it back to the baby. LC praised mom for her pumping and continue pumping.)   Maternal Data    Feeding Mother's Current Feeding Choice: Breast Milk and Formula Nipple Type: Dr. Jonna Galli   Lactation Tools Discussed/Used Tools: Pump;Flanges Flange Size: 18 Breast pump type: Double-Electric Breast Pump;Manual Pump Education: Milk Storage;Other (comment);Setup, frequency, and cleaning  Interventions Interventions: Breast feeding basics reviewed;Education;LC Services brochure;CDC Guidelines for Breast Pump Cleaning  Discharge Pump: Stork Pump  Consult Status Consult Status: Follow-up Date: 05/23/23 Follow-up type: In-patient    Chelsea Armstrong 05/22/2023, 3:54 PM

## 2023-05-23 ENCOUNTER — Encounter: Payer: Self-pay | Admitting: Obstetrics and Gynecology

## 2023-05-23 ENCOUNTER — Ambulatory Visit (HOSPITAL_COMMUNITY): Payer: Self-pay

## 2023-05-23 NOTE — Lactation Note (Signed)
 This note was copied from a baby's chart. Lactation Consultation Note  Patient Name: Chelsea Armstrong Date: 05/23/2023 Age:21 days Reason for consult: Follow-up assessment;1st time breastfeeding;Term;Infant weight loss;Engorgement  P2- MOB has been discharged and MOB was preparing infant to be discharged when [facility] came into the room to consult. MOB reports that she plans to offer both breast and bottled formula to infant, but hasn't latched infant since his birth date. MOB also reports that she woke up in so much pain (in her chest) that she could not move this morning. LC requested to examine her breasts. MOB consented. LC noted that MOB was severely engorged bilaterally. Her breasts were tight, shinny and painful to touch. MOB could not lift arm over her head due to the pain.  LC requested to assist MOB with engorgement and MOB consented. LC placed damp heat on MOB's breasts for 5 minutes as LC set up the DEBP. MOB pumped for 25 minutes with 18 mm flanges. MOB collected 13 mL from left breast and 2 mL from right breast. MOB reported more comfort in the left breast (only) after pumping. LC then had MOB ice her breasts for 15 minutes. LC believes that MOB is at high risk for re-admission due to mastitis. LC assessed MOB for mastitis, but she does not present with the signs and symptoms at this time. LC notified infant's RN of MOB's status. LC educated MOB on signs and symptoms of mastitis, engorgement/breast care, CDC milk storage guidelines and LC services handout. LC encouraged MOB to call LC if she has further questions or concerns. LC encouraged MOB to call her OB or go to [facility]U if she starts developing signs and symptoms of mastitis.  Maternal Data Has patient been taught Hand Expression?: Yes Does the patient have breastfeeding experience prior to this delivery?: No  Feeding Mother's Current Feeding Choice: Breast Milk and Formula Nipple Type: Dr. Jonna Galli  Lactation Tools  Discussed/Used Tools: Pump;Flanges Flange Size: 18 Breast pump type: Double-Electric Breast Pump;Manual Pump Education: Setup, frequency, and cleaning;Milk Storage Reason for Pumping: engorgement Pumping frequency: 15-20 min evrey 2-3 hrs as needed Pumped volume: 15 mL  Interventions Interventions: Breast feeding basics reviewed;Breast massage;Expressed milk;DEBP;Ice;Education;LC Services brochure  Discharge Discharge Education: Engorgement and breast care;Warning signs for feeding baby Pump: DEBP;Stork Pump  Consult Status Consult Status: Complete Date: 05/23/23    Recardo Hoit BS, IBCLC 05/23/2023, 4:26 PM

## 2023-05-24 ENCOUNTER — Encounter: Payer: Self-pay | Admitting: Women's Health

## 2023-05-26 ENCOUNTER — Other Ambulatory Visit: Payer: Medicaid Other

## 2023-05-26 ENCOUNTER — Encounter: Payer: Medicaid Other | Admitting: Women's Health

## 2023-05-27 ENCOUNTER — Ambulatory Visit: Payer: Medicaid Other | Admitting: Obstetrics & Gynecology

## 2023-05-27 ENCOUNTER — Encounter: Payer: Self-pay | Admitting: Obstetrics & Gynecology

## 2023-05-27 DIAGNOSIS — K5909 Other constipation: Secondary | ICD-10-CM

## 2023-05-27 MED ORDER — DOCUSATE SODIUM 100 MG PO CAPS: 100.0000 mg | ORAL_CAPSULE | Freq: Two times a day (BID) | ORAL | 1 refills | Status: AC

## 2023-05-27 NOTE — Progress Notes (Signed)
    PostOp Visit Note  Chelsea Armstrong is a 23 y.o. [id number] female who presents for a postoperative visit. She is 1 week s/p NSVD on 05/20/2023.  Noted to have irregular laceration with h/o 4th degree.   Today she notes that she has some soreness vaginally, but otherwise doing ok.   Denies fever or chills.  Tolerating gen diet.  Pt notes that she had a BM while in the hospital, but has not had a BM since.  States when she tries to go she notes pain and is afraid.  Denies passing flatus or stool through vagina.  She denies any urinary concerns.   Overall doing well and reports no acute complaints   Review of Systems Pertinent items are noted in HPI.    Objective:  BP 100/63 (BP Location: Right Arm, Patient Position: Sitting, Cuff Size: Normal)   Pulse 87   Ht 5' (1.524 m)   Wt 183 lb 9.6 oz (83.3 kg)   LMP 08/18/2022   Breastfeeding Yes   BMI 35.86 kg/m    Physical Examination:  GENERAL ASSESSMENT: well developed and well nourished SKIN: warm and dry CHEST: normal air exchange, respiratory effort normal with no retractions HEART: regular rate and rhythm ABDOMEN: soft, non-distended, no rebound, no guarding GU: normal external genitalia, appropriate lochia.  Perineum intact- suture noted.  Appropriately tender.  No evidence of breakdown EXTREMITY: no calf tenderness bilaterally PSYCH: mood appropriate, normal affect       Assessment:    Postpartum check  Constipation  Plan:   -healing appropriately, reviewed perineal care -strongly encouraged colace daily and reviewed other OTC remedies for constipation.  Advised consideration of enema if she does not have a BM this weekend -f/u as scheduled for routine 6wk postpartum visit  Myna Hidalgo, DO Attending Obstetrician & Gynecologist, [facility] [employer], [facility]

## 2023-06-01 ENCOUNTER — Encounter (HOSPITAL_COMMUNITY): Payer: Medicaid Other

## 2023-06-02 ENCOUNTER — Encounter: Payer: Self-pay | Admitting: Obstetrics & Gynecology

## 2023-06-06 ENCOUNTER — Other Ambulatory Visit: Payer: Medicaid Other

## 2023-06-06 DIAGNOSIS — R8290 Unspecified abnormal findings in urine: Secondary | ICD-10-CM

## 2023-06-06 DIAGNOSIS — R300 Dysuria: Secondary | ICD-10-CM | POA: Diagnosis not present

## 2023-06-06 LAB — POCT URINALYSIS DIPSTICK OB
Glucose, UA: NEGATIVE
Ketones, UA: NEGATIVE
Nitrite, UA: NEGATIVE
POC,PROTEIN,UA: NEGATIVE

## 2023-06-06 NOTE — Progress Notes (Signed)
   NURSE VISIT- UTI SYMPTOMS   SUBJECTIVE:  Chelsea Armstrong is a 23 y.o. [phone] female here for UTI symptoms. She is postpartum. She reports cloudy urine, dysuria, and abnormal odor of urine .  Notices some burning when urine hits vaginal stitches. Also has not been drinking a lot of water.  OBJECTIVE:  LMP 08/18/2022   Breastfeeding Yes   Appears well, in no apparent distress  Results for orders placed or performed in visit on 06/06/23 (from the past 24 hours)  POC Urinalysis Dipstick OB   Collection Time: 06/06/23  2:52 PM  Result Value Ref Range   Color, UA     Clarity, UA     Glucose, UA Negative Negative   Bilirubin, UA     Ketones, UA neg    Spec Grav, UA     Blood, UA moderate    pH, UA     POC,PROTEIN,UA Negative Negative, Trace, Small (1+), Moderate (2+), Large (3+), 4+   Urobilinogen, UA     Nitrite, UA neg    Leukocytes, UA Trace (A) Negative   Appearance     Odor      ASSESSMENT: Postpartum with UTI symptoms and negative nitrites  PLAN: Note routed to Cyril Mourning, AGNP   Rx sent by provider today: No Urine culture sent Call or return to clinic prn if these symptoms worsen or fail to improve as anticipated. Advised to dilute urine with peri bottle before voiding to see if that helps burning. Increase water intake Follow-up: as scheduled   Jobe Marker  06/06/2023 2:53 PM

## 2023-06-07 LAB — URINALYSIS, ROUTINE W REFLEX MICROSCOPIC
Bilirubin, UA: NEGATIVE
Glucose, UA: NEGATIVE
Ketones, UA: NEGATIVE
Nitrite, UA: POSITIVE — AB
Protein,UA: NEGATIVE
Specific Gravity, UA: 1.018 (ref 1.005–1.030)
Urobilinogen, Ur: 0.2 mg/dL (ref 0.2–1.0)
pH, UA: 6.5 (ref 5.0–7.5)

## 2023-06-07 LAB — MICROSCOPIC EXAMINATION
Casts: NONE SEEN /[LPF]
Epithelial Cells (non renal): NONE SEEN /[HPF] (ref 0–10)
WBC, UA: >30 /[HPF] — AB (ref 0–5)

## 2023-06-09 ENCOUNTER — Telehealth: Payer: Self-pay | Admitting: Obstetrics & Gynecology

## 2023-06-09 ENCOUNTER — Encounter: Payer: Self-pay | Admitting: Obstetrics & Gynecology

## 2023-06-09 ENCOUNTER — Other Ambulatory Visit: Payer: Self-pay | Admitting: Obstetrics & Gynecology

## 2023-06-09 DIAGNOSIS — N3000 Acute cystitis without hematuria: Secondary | ICD-10-CM

## 2023-06-09 MED ORDER — SULFAMETHOXAZOLE-TRIMETHOPRIM 800-160 MG PO TABS: 1.0000 | ORAL_TABLET | Freq: Two times a day (BID) | ORAL | 0 refills | Status: AC

## 2023-06-09 NOTE — Progress Notes (Signed)
Rx sent in for UTI treatment  Chelsea Hidalgo, DO Attending Obstetrician & Gynecologist, [facility] for [employer], [facility]

## 2023-06-09 NOTE — Telephone Encounter (Signed)
Patient called and wanted to make sure that you send her script to [facility]

## 2023-06-12 LAB — URINE CULTURE

## 2023-06-22 ENCOUNTER — Ambulatory Visit: Payer: Medicaid Other | Admitting: Obstetrics & Gynecology

## 2023-06-23 ENCOUNTER — Other Ambulatory Visit: Payer: Self-pay | Admitting: Obstetrics & Gynecology

## 2023-06-23 DIAGNOSIS — D509 Iron deficiency anemia, unspecified: Secondary | ICD-10-CM

## 2023-06-23 NOTE — Progress Notes (Signed)
-  Called patient to review bleeding -She notes that she is having heavier brown bleeding using about 3 large pads per day -Patient has For contraception -Denies dizziness syncopal episode -Medical records reviewed prior hemoglobin 7.9 -Recommended patient come in for CBC for further evaluation, she would prefer to wait when she comes in on 2/20 -Reviewed bleeding precautions and to follow-up either in our office or [facility]U if needed  Myna Hidalgo, DO Attending Obstetrician & Gynecologist, [facility] [employer], [facility]

## 2023-06-30 ENCOUNTER — Ambulatory Visit: Payer: Medicaid Other | Admitting: Advanced Practice Midwife

## 2023-07-05 ENCOUNTER — Ambulatory Visit: Payer: Medicaid Other | Admitting: Women's Health

## 2023-07-05 ENCOUNTER — Encounter: Payer: Self-pay | Admitting: Women's Health

## 2023-07-05 DIAGNOSIS — R8290 Unspecified abnormal findings in urine: Secondary | ICD-10-CM | POA: Diagnosis not present

## 2023-07-05 DIAGNOSIS — D649 Anemia, unspecified: Secondary | ICD-10-CM | POA: Diagnosis not present

## 2023-07-05 DIAGNOSIS — Z30017 Encounter for initial prescription of implantable subdermal contraceptive: Secondary | ICD-10-CM | POA: Insufficient documentation

## 2023-07-05 LAB — POCT HEMOGLOBIN: Hemoglobin: 12 g/dL (ref 11–14.6)

## 2023-07-05 NOTE — Patient Instructions (Signed)
 Tips To Increase Milk Supply Lots of water! Enough so that your urine is clear Plenty of calories, if you're not getting enough calories, your milk supply can decrease Breastfeed/pump often, every 2-3 hours x 20-33mins Fenugreek 3 pills 3 times a day, this may make your urine smell like maple syrup Mother's Milk Tea Lactation cookies, google for the recipe Real oatmeal Body Armor sports drinks Liquid Gold Greater Than hydration drink

## 2023-07-05 NOTE — Progress Notes (Signed)
 POSTPARTUM VISIT Patient name: Chelsea Armstrong MRN [id number]  Date of birth: January 08, 2001 Chief Complaint:   Postpartum Care (Still bleeding since delivery)  History of Present Illness:   Chelsea Armstrong is a 23 y.o. [id number] Caucasian female being seen today for a postpartum visit. She is 6 weeks postpartum following a spontaneous vaginal delivery at 39.2 gestational weeks. IOL: yes, for suspected LGA w/ h/o 4th degree lac. Anesthesia: epidural.  Laceration: 2nd degree.  Complications: none. Inpatient contraception: yes Nexplanon inserted 1/11 .   Pregnancy complicated by severe abd/pelvic pain, LGA . Tobacco use: no. Substance use disorder: no. Last pap smear: <21yo and results were N/A. Next pap smear due: @23yo  Patient's last menstrual period was 08/18/2022.  Postpartum course has been complicated by symptomatic anemia, post delivery hgb 7.9, received IV Fe . Bad odor to urine, no other sx. Bleeding none (stopped yesterday). Bowel function is normal. Bladder function is normal. Urinary incontinence? no, fecal incontinence? no Patient is sexually active. Last sexual activity:  did not discuss . Desired contraception: Nexplanon placed at hospital  . Patient does not know want a pregnancy in the future.  Desired family size is uncertain #of children.   Upstream - 07/05/23 1415       Pregnancy Intention Screening   Does the patient want to become pregnant in the next year? No    Does the patient's partner want to become pregnant in the next year? No    Would the patient like to discuss contraceptive options today? No      Contraception Wrap Up   Current Method Hormonal Implant    End Method Hormonal Implant    Contraception Counseling Provided No            The pregnancy intention screening data noted above was reviewed. Potential methods of contraception were discussed. The patient elected to proceed with Hormonal Implant.  Edinburgh Postpartum Depression Screening: negative   Edinburgh Postnatal Depression Scale - 07/05/23 1418       Edinburgh Postnatal Depression Scale:  In the Past 7 Days   I have been able to laugh and see the funny side of things. 0    I have looked forward with enjoyment to things. 1    I have blamed myself unnecessarily when things went wrong. 0    I have been anxious or worried for no good reason. 0    I have felt scared or panicky for no good reason. 0    Things have been getting on top of me. 0    I have been so unhappy that I have had difficulty sleeping. 0    I have felt sad or miserable. 1    I have been so unhappy that I have been crying. 0    The thought of harming myself has occurred to me. 0    Edinburgh Postnatal Depression Scale Total 2                03/02/2023   10:03 AM 12/09/2022   10:32 AM 02/04/2021    3:40 PM 12/25/2020    3:46 PM  GAD 7 : Generalized Anxiety Score  Nervous, Anxious, on Edge 0 0 1 3  Control/stop worrying 0 0 1 2  Worry too much - different things 0 0 1 0  Trouble relaxing 0 2 0 0  Restless 0 0 0 2  Easily annoyed or irritable 1 3 1 1   Afraid - awful might happen 0 0 0  2  Total GAD 7 Score 1 5 4 10      Baby's course has been uncomplicated. Baby is feeding by breast and bottle: milk supply inadequate . Infant has a pediatrician/family doctor? Yes.  Childcare strategy if returning to work/school: daycare.  Pt has material needs met for her and baby: Yes.   Review of Systems:   Pertinent items are noted in HPI Denies Abnormal vaginal discharge w/ itching/odor/irritation, headaches, visual changes, shortness of breath, chest pain, abdominal pain, severe nausea/vomiting, or problems with urination or bowel movements. Pertinent History Reviewed:  Reviewed past medical,surgical, obstetrical and family history.  Reviewed problem list, medications and allergies. OB History  Gravida Para Term Preterm AB Living  2 2 2   2   SAB IAB Ectopic Multiple Live Births     0 2    # Outcome Date GA Lbr  Len/2nd Weight Sex Type Anes PTL Lv  2 Term 05/20/23 [redacted]w[redacted]d / 02:41 10 lb 3.7 oz (4.64 kg) M Vag-Spont EPI  LIV  1 Term 07/30/21 [redacted]w[redacted]d 15:02 / 04:23 9 lb 9.8 oz (4.36 kg) M Vag-Vacuum EPI  LIV     Birth Comments: abrasion noted where vacuum was placed   Physical Assessment:   Vitals:   07/05/23 1418  BP: 121/75  Pulse: 88  Weight: 185 lb 9.6 oz (84.2 kg)  Height: 5' (1.524 m)  Body mass index is 36.25 kg/m.       Physical Examination:   General appearance: alert, well appearing, and in no distress  Mental status: alert, oriented to person, place, and time  Skin: warm & dry   Cardiovascular: normal heart rate noted   Respiratory: normal respiratory effort, no distress   Breasts: deferred, no complaints   Abdomen: soft, non-tender   Pelvic: lac well healed. Thin prep pap obtained: No  Rectal: not examined  Extremities: Edema: none   Chaperone: Jobe Marker         Results for orders placed or performed in visit on 07/05/23 (from the past 24 hours)  POCT hemoglobin   Collection Time: 07/05/23  2:26 PM  Result Value Ref Range   Hemoglobin 12 11 - 14.6 g/dL    Assessment & Plan:  1) Postpartum exam 2) 6 wks s/p spontaneous vaginal delivery after IOL for LGA w/ h/o 4th degree 3) breast & bottle feeding> milk tips given 4) Depression screening 5) Contraception s/p Nexplanon insertion 1/11 6) Bad odor to urine> dipstick and send for cx  Essential components of care per ACOG recommendations:  1.  Mood and well being:  If positive depression screen, discussed and plan developed.  If using tobacco we discussed reduction/cessation and risk of relapse If current substance abuse, we discussed and referral to local resources was offered.   2. Infant care and feeding:  If breastfeeding, discussed returning to work, pumping, breastfeeding-associated pain, guidance regarding return to fertility while lactating if not using another method. If needed, patient was provided with a  letter to be allowed to pump q 2-3hrs to support lactation in a private location with access to a refrigerator to store breastmilk.   Recommended that all caregivers be immunized for flu, pertussis and other preventable communicable diseases If pt does not have material needs met for her/baby, referred to local resources for help obtaining these.  3. Sexuality, contraception and birth spacing Provided guidance regarding sexuality, management of dyspareunia, and resumption of intercourse Discussed avoiding interpregnancy interval <65mths and recommended birth spacing of 18 months  4. Sleep  and fatigue Discussed coping options for fatigue and sleep disruption Encouraged family/partner/community support of 4 hrs of uninterrupted sleep to help with mood and fatigue  5. Physical recovery  If pt had a C/S, assessed incisional pain and providing guidance on normal vs prolonged recovery If pt had a laceration, perineal healing and pain reviewed.  If urinary or fecal incontinence, discussed management and referred to PT or uro/gyn if indicated  Patient is safe to resume physical activity. Discussed attainment of healthy weight.  6.  Chronic disease management Discussed pregnancy complications if any, and their implications for future childbearing and long-term maternal health. Review recommendations for prevention of recurrent pregnancy complications, such as 17 hydroxyprogesterone caproate to reduce risk for recurrent PTB not applicable, or aspirin to reduce risk of preeclampsia not applicable. Pt had GDM: no. If yes, 2hr GTT scheduled: not applicable. Reviewed medications and non-pregnant dosing including consideration of whether pt is breastfeeding using a reliable resource such as LactMed: yes Referred for f/u w/ PCP or subspecialist providers as indicated: not applicable  7. Health maintenance Mammogram at 21yo or earlier if indicated Pap smears as indicated  Meds: No orders of the defined  types were placed in this encounter.   Follow-up: Return for after 8/10 for , Pap & physical.   Orders Placed This Encounter  Procedures   Urine Culture   POCT hemoglobin    Cheral Marker CNM, [facility] 07/05/2023 2:59 PM

## 2023-07-11 ENCOUNTER — Ambulatory Visit: Payer: Medicaid Other | Admitting: Advanced Practice Midwife

## 2023-08-09 ENCOUNTER — Encounter: Payer: Self-pay | Admitting: Women's Health

## 2023-08-19 ENCOUNTER — Encounter: Payer: Self-pay | Admitting: Advanced Practice Midwife

## 2023-08-30 ENCOUNTER — Ambulatory Visit: Admitting: Obstetrics and Gynecology

## 2023-08-30 ENCOUNTER — Encounter: Payer: Self-pay | Admitting: Obstetrics and Gynecology

## 2023-08-30 VITALS — BP 113/78 | HR 80 | Ht 60.0 in | Wt 189.4 lb

## 2023-08-30 DIAGNOSIS — Z3046 Encounter for surveillance of implantable subdermal contraceptive: Secondary | ICD-10-CM

## 2023-08-30 DIAGNOSIS — Z30430 Encounter for insertion of intrauterine contraceptive device: Secondary | ICD-10-CM | POA: Diagnosis not present

## 2023-08-30 DIAGNOSIS — Z3202 Encounter for pregnancy test, result negative: Secondary | ICD-10-CM | POA: Diagnosis not present

## 2023-08-30 LAB — POCT URINE PREGNANCY: Preg Test, Ur: NEGATIVE

## 2023-08-30 MED ORDER — PARAGARD INTRAUTERINE COPPER IU IUD
1.0000 | INTRAUTERINE_SYSTEM | Freq: Once | INTRAUTERINE | Status: AC
  Administered 2023-08-30: 1 via INTRAUTERINE

## 2023-08-30 NOTE — Progress Notes (Signed)
     GYNECOLOGY OFFICE PROCEDURE NOTE  Chelsea Armstrong is a 23 y.o. [phone] here for Nexplanon  removal. Last pap smear was on n/a < 21y.o..  No other gynecologic concerns.  Nexplanon  Removal Patient identified, informed consent performed, consent signed.   Appropriate time out taken.   Nexplanon  site identified.  Area prepped in usual sterile fashon. One ml of 1% lidocaine  was used to anesthetize the area at the distal end of the implant. A small stab incision was made right beside the implant on the distal portion.  The Nexplanon  rod was grasped using hemostats and removed without difficulty.  There was minimal blood loss. There were no complications.  3 ml of 1% lidocaine  was injected around the incision for post-procedure analgesia.  Steri-strips were applied over the small incision.  A pressure bandage was applied to reduce any bruising.    The patient tolerated the procedure well and was given post procedure instructions.    IUD Insertion Procedure Note Patient identified, informed consent performed, consent signed.   Discussed risks of irregular bleeding, cramping, infection, malpositioning or misplacement of the IUD outside the uterus which may require further procedure such as laparoscopy. Time out was performed.  Urine pregnancy test negative.  Speculum placed in the vagina.  Cervix visualized.  Cleaned with Betadine x 2.  Grasped anteriorly with a single tooth tenaculum.  Uterus sounded to 8 cm.  Paragard  IUD placed per manufacturer's recommendations.  Strings trimmed to 3 cm. Tenaculum was removed, good hemostasis noted.  Patient tolerated procedure well.   Patient was given post-procedure instructions.  She was advised to have backup contraception for one week.  Patient was also asked to check IUD strings periodically and follow up in 4 weeks for IUD check.    Susi Eric, FNP [facility] [employer], [facility]

## 2023-09-03 DIAGNOSIS — S299XXA Unspecified injury of thorax, initial encounter: Secondary | ICD-10-CM | POA: Diagnosis not present

## 2023-09-03 DIAGNOSIS — S6982XA Other specified injuries of left wrist, hand and finger(s), initial encounter: Secondary | ICD-10-CM | POA: Diagnosis not present

## 2023-09-03 DIAGNOSIS — S098XXA Other specified injuries of head, initial encounter: Secondary | ICD-10-CM | POA: Diagnosis not present

## 2023-09-03 DIAGNOSIS — S59812A Other specified injuries left forearm, initial encounter: Secondary | ICD-10-CM | POA: Diagnosis not present

## 2023-09-03 DIAGNOSIS — W501XXA Accidental kick by another person, initial encounter: Secondary | ICD-10-CM | POA: Diagnosis not present

## 2023-09-03 DIAGNOSIS — S6992XA Unspecified injury of left wrist, hand and finger(s), initial encounter: Secondary | ICD-10-CM | POA: Diagnosis not present

## 2023-09-03 DIAGNOSIS — S52502A Unspecified fracture of the lower end of left radius, initial encounter for closed fracture: Secondary | ICD-10-CM | POA: Diagnosis not present

## 2023-09-12 DIAGNOSIS — Z6836 Body mass index (BMI) 36.0-36.9, adult: Secondary | ICD-10-CM | POA: Diagnosis not present

## 2023-09-12 DIAGNOSIS — E669 Obesity, unspecified: Secondary | ICD-10-CM | POA: Diagnosis not present

## 2023-09-12 DIAGNOSIS — U071 COVID-19: Secondary | ICD-10-CM | POA: Diagnosis not present

## 2023-09-15 DIAGNOSIS — S6992XA Unspecified injury of left wrist, hand and finger(s), initial encounter: Secondary | ICD-10-CM | POA: Diagnosis not present

## 2023-09-27 ENCOUNTER — Ambulatory Visit: Admitting: Obstetrics and Gynecology

## 2023-09-27 ENCOUNTER — Encounter: Payer: Self-pay | Admitting: Obstetrics and Gynecology

## 2023-09-27 VITALS — BP 109/76 | Ht 60.0 in | Wt 189.6 lb

## 2023-09-27 DIAGNOSIS — R350 Frequency of micturition: Secondary | ICD-10-CM | POA: Diagnosis not present

## 2023-09-27 DIAGNOSIS — Z30431 Encounter for routine checking of intrauterine contraceptive device: Secondary | ICD-10-CM | POA: Diagnosis not present

## 2023-09-27 LAB — POCT URINALYSIS DIPSTICK
Blood, UA: 3
Glucose, UA: NEGATIVE
Ketones, UA: NEGATIVE
Leukocytes, UA: NEGATIVE
Nitrite, UA: NEGATIVE
Protein, UA: NEGATIVE

## 2023-09-27 NOTE — Progress Notes (Signed)
   GYNECOLOGY PROGRESS NOTE  History:  [name] presents to [facility] for IUD string check. Doing well with IUD in place no complaints, reports normal period.  Desires to be assessed for UTI, reports urinary frequency and odor. Denies burning, itching, irritation   The following portions of the patient's history were reviewed and updated as appropriate: allergies, current medications, past family history, past medical history, past social history, past surgical history and problem list. Last pap smear on n/a < 21 y.o.  Health Maintenance Due  Topic Date Due   HPV VACCINES (1 - 3-dose series) Never done   Meningococcal B Vaccine (1 of 2 - Standard) Never done   COVID-19 Vaccine (2 - 2024-25 season) 01/09/2023     Review of Systems:  Pertinent items are noted in HPI.   Objective:  Physical Exam Blood pressure 109/76, height 5' (1.524 m), weight 189 lb 9.6 oz (86 kg), last menstrual period 09/27/2023, not currently breastfeeding. VS reviewed, nursing note reviewed,  Constitutional: well developed, well nourished, no distress Pulm/chest wall: normal effort Abdomen: soft Neuro: alert and oriented  Skin: warm, dry Psych: affect normal Pelvic exam: Cervix pink, visually closed, without lesion, scant white creamy discharge, vaginal walls and external genitalia normal  IUD strings visualized and appropriate length  Chaperone present for exam   Assessment & Plan:  1. Encounter for routine checking of intrauterine contraceptive device (IUD) (Primary) Continue with IUD 2035, unless desires removal sooner  Discussed first pap next visit   2. Urinary frequency  - POCT Urinalysis Dipstick   Return in about 3 months (around 12/28/2023) for Chelsea Hinders, FNP 2:24 PM

## 2023-09-30 ENCOUNTER — Encounter: Payer: Self-pay | Admitting: Women's Health

## 2023-09-30 MED ORDER — MEGESTROL ACETATE 40 MG PO TABS: ORAL_TABLET | ORAL | 3 refills | Status: DC

## 2023-10-03 MED ORDER — MEGESTROL ACETATE 40 MG PO TABS: ORAL_TABLET | ORAL | 3 refills | Status: DC

## 2023-10-03 NOTE — Addendum Note (Signed)
 Addended by: Wendelyn Halter on: 10/03/2023 03:07 PM   Modules accepted: Orders

## 2023-10-11 ENCOUNTER — Encounter: Payer: Self-pay | Admitting: *Deleted

## 2023-11-04 ENCOUNTER — Encounter: Payer: Self-pay | Admitting: Advanced Practice Midwife

## 2023-11-08 ENCOUNTER — Ambulatory Visit (INDEPENDENT_AMBULATORY_CARE_PROVIDER_SITE_OTHER): Admitting: *Deleted

## 2023-11-08 ENCOUNTER — Other Ambulatory Visit (HOSPITAL_COMMUNITY)
Admission: RE | Admit: 2023-11-08 | Discharge: 2023-11-08 | Disposition: A | Discharge status: Home / Self Care | Source: Ambulatory Visit | Attending: Obstetrics & Gynecology | Admitting: Obstetrics & Gynecology

## 2023-11-08 DIAGNOSIS — Z113 Encounter for screening for infections with a predominantly sexual mode of transmission: Secondary | ICD-10-CM | POA: Insufficient documentation

## 2023-11-08 DIAGNOSIS — R300 Dysuria: Secondary | ICD-10-CM

## 2023-11-08 NOTE — Progress Notes (Signed)
   NURSE VISIT- VAGINITIS/STD/POC  SUBJECTIVE:  Chelsea Armstrong is a 23 y.o. [id number] GYN patientfemale here for a vaginal swab for vaginitis screening, STD screen.  She reports the following symptoms: none for 0 days. Denies abnormal vaginal bleeding, significant pelvic pain, fever,   OBJECTIVE:  There were no vitals taken for this visit.  Appears well, in no apparent distress  ASSESSMENT: Vaginal swab for STD screen  PLAN: Self-collected vaginal probe for Gonorrhea, Chlamydia, Trichomonas, Bacterial Vaginosis, Yeast sent to lab Treatment: to be determined once results are received Follow-up as needed if symptoms persist/worsen, or new symptoms develop  Also sent urine for urinalysis and culture.   Alan LITTIE Fischer  11/08/2023 2:10 PM

## 2023-11-09 ENCOUNTER — Ambulatory Visit: Payer: Self-pay | Admitting: Obstetrics & Gynecology

## 2023-11-09 DIAGNOSIS — N3000 Acute cystitis without hematuria: Secondary | ICD-10-CM

## 2023-11-09 DIAGNOSIS — R300 Dysuria: Secondary | ICD-10-CM | POA: Diagnosis not present

## 2023-11-09 DIAGNOSIS — N760 Acute vaginitis: Secondary | ICD-10-CM

## 2023-11-09 LAB — HEPATITIS B SURFACE ANTIGEN: Hepatitis B Surface Ag: NEGATIVE

## 2023-11-09 LAB — RPR: RPR Ser Ql: NONREACTIVE

## 2023-11-09 LAB — HIV ANTIBODY (ROUTINE TESTING W REFLEX): HIV Screen 4th Generation wRfx: NONREACTIVE

## 2023-11-10 LAB — CERVICOVAGINAL ANCILLARY ONLY
Bacterial Vaginitis (gardnerella): NEGATIVE
Candida Glabrata: NEGATIVE
Candida Vaginitis: POSITIVE — AB
Chlamydia: NEGATIVE
Comment: NEGATIVE
Comment: NEGATIVE
Comment: NEGATIVE
Comment: NEGATIVE
Comment: NEGATIVE
Comment: NORMAL
Neisseria Gonorrhea: NEGATIVE
Trichomonas: NEGATIVE

## 2023-11-10 LAB — URINALYSIS
Bilirubin, UA: NEGATIVE
Glucose, UA: NEGATIVE
Ketones, UA: NEGATIVE
Nitrite, UA: POSITIVE — AB
RBC, UA: NEGATIVE
Specific Gravity, UA: 1.023 (ref 1.005–1.030)
Urobilinogen, Ur: 1 mg/dL (ref 0.2–1.0)
pH, UA: 6 (ref 5.0–7.5)

## 2023-11-10 MED ORDER — FLUCONAZOLE 150 MG PO TABS
ORAL_TABLET | ORAL | 1 refills | Status: AC
Start: 2023-11-10 — End: ?

## 2023-11-10 MED ORDER — SULFAMETHOXAZOLE-TRIMETHOPRIM 800-160 MG PO TABS: 1.0000 | ORAL_TABLET | Freq: Two times a day (BID) | ORAL | 0 refills | Status: AC

## 2023-11-13 LAB — URINE CULTURE

## 2023-11-13 MED ORDER — NITROFURANTOIN MONOHYD MACRO 100 MG PO CAPS: 100.0000 mg | ORAL_CAPSULE | Freq: Two times a day (BID) | ORAL | 0 refills | Status: AC

## 2023-12-05 ENCOUNTER — Encounter: Payer: Self-pay | Admitting: Women's Health

## 2023-12-07 ENCOUNTER — Ambulatory Visit: Admitting: Orthopedic Surgery

## 2023-12-25 ENCOUNTER — Encounter: Payer: Self-pay | Admitting: Women's Health

## 2023-12-26 ENCOUNTER — Encounter: Payer: Self-pay | Admitting: Women's Health

## 2023-12-27 ENCOUNTER — Ambulatory Visit (INDEPENDENT_AMBULATORY_CARE_PROVIDER_SITE_OTHER): Admitting: *Deleted

## 2023-12-27 DIAGNOSIS — R8290 Unspecified abnormal findings in urine: Secondary | ICD-10-CM

## 2023-12-27 LAB — POCT URINALYSIS DIPSTICK OB
Glucose, UA: NEGATIVE
Ketones, UA: NEGATIVE
Nitrite, UA: NEGATIVE

## 2023-12-27 NOTE — Progress Notes (Signed)
   NURSE VISIT- UTI SYMPTOMS   SUBJECTIVE:  Chelsea Armstrong is a 23 y.o. [phone] female here for UTI symptoms. She is a GYN patient. She reports bad odor to urine.  OBJECTIVE:  There were no vitals taken for this visit.  Appears well, in no apparent distress  Results for orders placed or performed in visit on 12/27/23 (from the past 24 hours)  POC Urinalysis Dipstick OB   Collection Time: 12/27/23  4:35 PM  Result Value Ref Range   Color, UA     Clarity, UA     Glucose, UA Negative Negative   Bilirubin, UA     Ketones, UA neg    Spec Grav, UA     Blood, UA 3+    pH, UA     POC,PROTEIN,UA Small (1+) Negative, Trace, Small (1+), Moderate (2+), Large (3+), 4+   Urobilinogen, UA     Nitrite, UA negative    Leukocytes, UA Trace (A) Negative   Appearance     Odor      ASSESSMENT: GYN patient with UTI symptoms and negative nitrites  PLAN: Note routed to Delon Lewis, AGNP   Rx sent by provider today: No Urine culture sent Call or return to clinic prn if these symptoms worsen or fail to improve as anticipated. Follow-up: as scheduled   Nyelli Samara  12/27/2023 4:36 PM

## 2023-12-28 ENCOUNTER — Other Ambulatory Visit: Payer: Self-pay | Admitting: Women's Health

## 2023-12-28 ENCOUNTER — Ambulatory Visit: Admitting: Adult Health

## 2023-12-28 ENCOUNTER — Encounter: Payer: Self-pay | Admitting: Advanced Practice Midwife

## 2023-12-28 LAB — URINALYSIS, ROUTINE W REFLEX MICROSCOPIC
Bilirubin, UA: NEGATIVE
Glucose, UA: NEGATIVE
Ketones, UA: NEGATIVE
Nitrite, UA: NEGATIVE
Specific Gravity, UA: 1.024 (ref 1.005–1.030)
Urobilinogen, Ur: 0.2 mg/dL (ref 0.2–1.0)
pH, UA: 5.5 (ref 5.0–7.5)

## 2023-12-28 LAB — MICROSCOPIC EXAMINATION: Casts: NONE SEEN /LPF

## 2023-12-28 MED ORDER — MEGESTROL ACETATE 40 MG PO TABS: ORAL_TABLET | ORAL | 3 refills | Status: AC

## 2023-12-30 LAB — URINE CULTURE

## 2024-01-02 ENCOUNTER — Ambulatory Visit: Payer: Self-pay | Admitting: Adult Health

## 2024-01-02 MED ORDER — NITROFURANTOIN MONOHYD MACRO 100 MG PO CAPS
100.0000 mg | ORAL_CAPSULE | Freq: Two times a day (BID) | ORAL | 0 refills | Status: AC
Start: 2024-01-02 — End: ?

## 2024-01-04 ENCOUNTER — Ambulatory Visit: Admitting: Advanced Practice Midwife

## 2024-03-23 ENCOUNTER — Encounter: Payer: Self-pay | Admitting: Obstetrics & Gynecology
